# Patient Record
Sex: Female | Born: 1995 | Hispanic: Yes | Marital: Single | Smoking: Never smoker
Health system: Southern US, Community
[De-identification: ages and names within clinical notes are randomized; demographics above are authoritative.]

## PROBLEM LIST (undated history)

## (undated) ENCOUNTER — Inpatient Hospital Stay: Payer: Self-pay

## (undated) DIAGNOSIS — F4310 Post-traumatic stress disorder, unspecified: Secondary | ICD-10-CM

## (undated) DIAGNOSIS — O99019 Anemia complicating pregnancy, unspecified trimester: Secondary | ICD-10-CM

## (undated) DIAGNOSIS — F419 Anxiety disorder, unspecified: Secondary | ICD-10-CM

## (undated) DIAGNOSIS — F329 Major depressive disorder, single episode, unspecified: Secondary | ICD-10-CM

## (undated) DIAGNOSIS — F32A Depression, unspecified: Secondary | ICD-10-CM

## (undated) DIAGNOSIS — IMO0002 Reserved for concepts with insufficient information to code with codable children: Secondary | ICD-10-CM

## (undated) HISTORY — DX: Post-traumatic stress disorder, unspecified: F43.10

## (undated) HISTORY — PX: NO PAST SURGERIES: SHX2092

## (undated) HISTORY — DX: Anemia complicating pregnancy, unspecified trimester: O99.019

---
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
## 2017-02-03 ENCOUNTER — Emergency Department
Admission: EM | Admit: 2017-02-03 | Discharge: 2017-02-03 | Disposition: A | Discharge status: Home / Self Care | Payer: Self-pay | Attending: Emergency Medicine | Admitting: Emergency Medicine

## 2017-02-03 ENCOUNTER — Encounter: Payer: Self-pay | Admitting: Emergency Medicine

## 2017-02-03 DIAGNOSIS — N3000 Acute cystitis without hematuria: Secondary | ICD-10-CM | POA: Insufficient documentation

## 2017-02-03 DIAGNOSIS — J45909 Unspecified asthma, uncomplicated: Secondary | ICD-10-CM | POA: Insufficient documentation

## 2017-02-03 LAB — BASIC METABOLIC PANEL
ANION GAP: 5 (ref 5–15)
BUN: 11 mg/dL (ref 6–20)
CALCIUM: 8.8 mg/dL — AB (ref 8.9–10.3)
CO2: 27 mmol/L (ref 22–32)
Chloride: 106 mmol/L (ref 101–111)
Creatinine, Ser: 0.79 mg/dL (ref 0.44–1.00)
Glucose, Bld: 88 mg/dL (ref 65–99)
Potassium: 4 mmol/L (ref 3.5–5.1)
SODIUM: 138 mmol/L (ref 135–145)

## 2017-02-03 LAB — POCT PREGNANCY, URINE: PREG TEST UR: NEGATIVE

## 2017-02-03 LAB — URINALYSIS, COMPLETE (UACMP) WITH MICROSCOPIC
BACTERIA UA: NONE SEEN
Bilirubin Urine: NEGATIVE
Glucose, UA: NEGATIVE mg/dL
HGB URINE DIPSTICK: NEGATIVE
KETONES UR: NEGATIVE mg/dL
Nitrite: NEGATIVE
PROTEIN: NEGATIVE mg/dL
Specific Gravity, Urine: 1.019 (ref 1.005–1.030)
pH: 5 (ref 5.0–8.0)

## 2017-02-03 LAB — CBC
HEMATOCRIT: 34.6 % — AB (ref 35.0–47.0)
Hemoglobin: 11.4 g/dL — ABNORMAL LOW (ref 12.0–16.0)
MCH: 26.4 pg (ref 26.0–34.0)
MCHC: 33 g/dL (ref 32.0–36.0)
MCV: 80 fL (ref 80.0–100.0)
Platelets: 314 10*3/uL (ref 150–440)
RBC: 4.33 MIL/uL (ref 3.80–5.20)
RDW: 14.3 % (ref 11.5–14.5)
WBC: 11.2 10*3/uL — AB (ref 3.6–11.0)

## 2017-02-03 MED ORDER — SULFAMETHOXAZOLE-TRIMETHOPRIM 800-160 MG PO TABS: 1.0000 | ORAL_TABLET | Freq: Two times a day (BID) | ORAL | 0 refills | Status: AC

## 2017-02-03 MED ORDER — PHENAZOPYRIDINE HCL 200 MG PO TABS: 200.0000 mg | ORAL_TABLET | Freq: Three times a day (TID) | ORAL | 0 refills | Status: DC | PRN

## 2017-02-03 NOTE — Discharge Instructions (Signed)
Please call and schedule an appointment with urology if symptoms do not improve with medications. Take tylenol or ibuprofen if needed in addition to the pyridium for pain. Return to the ER for symptoms that change or worsen if unable to schedule an appointment with primary care or urology.
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
## 2017-02-03 NOTE — ED Triage Notes (Addendum)
Pt reports dysuria that started this afternoon. Pt also c/o pain at night. Pt states she is having right flank pain that started today. Pt states when she is urinating she stops mid stream because of the burning and is unable to go any further. Pt has hx of cystitis in May. Pt states yesterday Am she was feeling feverish and took tylenol, no fevers today.

## 2017-02-03 NOTE — ED Notes (Signed)
Patient refuses wheel chair. Patient verbalizes understanding of d/c instructions and follow-up. VS stable and pain controlled per patient.  Patient in NAD at time of d/c and denies further concerns regarding this visit. Patient stable at the time of departure from the unit, departing unit by the safest and most appropriate manner per that patients condition and limitations. Patient advised to return to the ED at any time for emergent concerns, or for new/worsening symptoms.

## 2017-02-03 NOTE — ED Notes (Signed)
Pt reports pain to right flank area 3 hrs ago, history of kidney infection. Denies any blood in urine reports burning sensation with urination

## 2017-09-13 ENCOUNTER — Other Ambulatory Visit: Payer: Self-pay

## 2017-09-13 DIAGNOSIS — R05 Cough: Secondary | ICD-10-CM | POA: Insufficient documentation

## 2017-09-13 DIAGNOSIS — Z79899 Other long term (current) drug therapy: Secondary | ICD-10-CM | POA: Insufficient documentation

## 2017-09-13 DIAGNOSIS — R21 Rash and other nonspecific skin eruption: Secondary | ICD-10-CM | POA: Insufficient documentation

## 2017-09-13 DIAGNOSIS — R1110 Vomiting, unspecified: Secondary | ICD-10-CM | POA: Insufficient documentation

## 2017-09-13 DIAGNOSIS — J45909 Unspecified asthma, uncomplicated: Secondary | ICD-10-CM | POA: Insufficient documentation

## 2017-09-13 NOTE — ED Triage Notes (Signed)
Pt arrives to ED via POV with c/o "rash on my face" that started at 5pm today and 1 episode of emesis after taking Nyquil 1 hr PTA. Pt states taking the Nyquil "because I couldn't sleep". Pt reports rhinitis, but denies any c/o cough, CP, or SHOB; no reports of abdominal pain. Pt is A&O, in NAD; RR even, regular, and unlabored.

## 2017-09-14 ENCOUNTER — Emergency Department
Admission: EM | Admit: 2017-09-14 | Discharge: 2017-09-14 | Disposition: A | Discharge status: Home / Self Care | Payer: Self-pay | Attending: Emergency Medicine | Admitting: Emergency Medicine

## 2017-09-14 DIAGNOSIS — R21 Rash and other nonspecific skin eruption: Secondary | ICD-10-CM

## 2017-09-14 DIAGNOSIS — R05 Cough: Secondary | ICD-10-CM

## 2017-09-14 DIAGNOSIS — R1110 Vomiting, unspecified: Secondary | ICD-10-CM

## 2017-09-14 DIAGNOSIS — R059 Cough, unspecified: Secondary | ICD-10-CM

## 2017-09-14 LAB — POCT PREGNANCY, URINE: Preg Test, Ur: NEGATIVE

## 2017-09-14 MED ORDER — ONDANSETRON HCL 4 MG PO TABS: 4.0000 mg | ORAL_TABLET | Freq: Three times a day (TID) | ORAL | 0 refills | Status: DC | PRN

## 2017-09-14 MED ORDER — BENZONATATE 100 MG PO CAPS: 100.0000 mg | ORAL_CAPSULE | Freq: Four times a day (QID) | ORAL | 0 refills | Status: DC | PRN

## 2017-09-14 NOTE — ED Notes (Signed)
Pt presents today with N/V. Pt is alone as chart states there is a legal guardian there is no name or phone number listed. Registration is checking further into this. RN will continue to monitor.

## 2017-09-14 NOTE — Discharge Instructions (Signed)
Please seek medical attention for any high fevers, chest pain, shortness of breath, change in behavior, persistent vomiting, bloody stool or any other new or concerning symptoms.  
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
## 2017-12-31 ENCOUNTER — Other Ambulatory Visit: Payer: Self-pay | Admitting: Nurse Practitioner

## 2017-12-31 DIAGNOSIS — Z369 Encounter for antenatal screening, unspecified: Secondary | ICD-10-CM

## 2018-01-17 ENCOUNTER — Ambulatory Visit
Admission: RE | Admit: 2018-01-17 | Discharge: 2018-01-17 | Disposition: A | Discharge status: Home / Self Care | Payer: Self-pay | Source: Ambulatory Visit | Attending: Maternal and Fetal Medicine | Admitting: Maternal and Fetal Medicine

## 2018-01-17 ENCOUNTER — Ambulatory Visit (HOSPITAL_BASED_OUTPATIENT_CLINIC_OR_DEPARTMENT_OTHER)
Admission: RE | Admit: 2018-01-17 | Discharge: 2018-01-17 | Disposition: A | Discharge status: Home / Self Care | Payer: Self-pay | Source: Ambulatory Visit | Attending: Maternal and Fetal Medicine | Admitting: Maternal and Fetal Medicine

## 2018-01-17 VITALS — BP 121/84 | HR 92 | Temp 98.3°F | Resp 18 | Wt 223.6 lb

## 2018-01-17 DIAGNOSIS — Z3A11 11 weeks gestation of pregnancy: Secondary | ICD-10-CM | POA: Insufficient documentation

## 2018-01-17 DIAGNOSIS — Z3169 Encounter for other general counseling and advice on procreation: Secondary | ICD-10-CM | POA: Insufficient documentation

## 2018-01-17 DIAGNOSIS — Z3481 Encounter for supervision of other normal pregnancy, first trimester: Secondary | ICD-10-CM

## 2018-01-17 DIAGNOSIS — Z369 Encounter for antenatal screening, unspecified: Secondary | ICD-10-CM | POA: Insufficient documentation

## 2018-01-17 NOTE — Progress Notes (Signed)
REferring MD: [facility] Length of Consultation: 30 minute consultation   Ms. Gloria Mccarthy  was referred to [facility] for genetic counseling to review prenatal screening and testing options.  This note summarizes the information we discussed.    We offered the following routine screening tests for this pregnancy:  First trimester screening, which includes nuchal translucency ultrasound screen and first trimester maternal serum marker screening.  The nuchal translucency has approximately an 80% detection rate for Down syndrome and can be positive for other chromosome abnormalities as well as congenital heart defects.  When combined with a maternal serum marker screening, the detection rate is up to 90% for Down syndrome and up to 97% for trisomy 18.     Maternal serum marker screening, a blood test that measures pregnancy proteins, can provide risk assessments for Down syndrome, trisomy 18, and open neural tube defects (spina bifida, anencephaly). Because it does not directly examine the fetus, it cannot positively diagnose or rule out these problems.  Targeted ultrasound uses high frequency sound waves to create an image of the developing fetus.  An ultrasound is often recommended as a routine means of evaluating the pregnancy.  It is also used to screen for fetal anatomy problems (for example, a heart defect) that might be suggestive of a chromosomal or other abnormality.   Should these screening tests indicate an increased concern, then the following additional testing options would be offered:  The chorionic villus sampling procedure is available for first trimester chromosome analysis.  This involves the withdrawal of a small amount of chorionic villi (tissue from the developing placenta).  Risk of pregnancy loss is estimated to be approximately 1 in 200 to 1 in 100 (0.5 to 1%).  There is approximately a 1% (1 in 100) chance that the CVS  chromosome results will be unclear.  Chorionic villi cannot be tested for neural tube defects.     Amniocentesis involves the removal of a small amount of amniotic fluid from the sac surrounding the fetus with the use of a thin needle inserted through the maternal abdomen and uterus.  Ultrasound guidance is used throughout the procedure.  Fetal cells from amniotic fluid are directly evaluated and > 99.5% of chromosome problems and > 98% of open neural tube defects can be detected. This procedure is generally performed after the 15th week of pregnancy.  The main risks to this procedure include complications leading to miscarriage in less than 1 in 200 cases (0.5%).  As another option for information if the pregnancy is suspected to be an an increased chance for certain chromosome conditions, we also reviewed the availability of cell free fetal DNA testing from maternal blood to determine whether or not the baby may have either Down syndrome, trisomy 5413, or trisomy 3318.  This test utilizes a maternal blood sample and DNA sequencing technology to isolate circulating cell free fetal DNA from maternal plasma.  The fetal DNA can then be analyzed for DNA sequences that are derived from the three most common chromosomes involved in aneuploidy, chromosomes 13, 18, and 21.  If the overall amount of DNA is greater than the expected level for any of these chromosomes, aneuploidy is suspected.  While we do not consider it a replacement for invasive testing and karyotype analysis, a negative result from this testing would be reassuring, though not a guarantee of a normal chromosome complement for the baby.  An abnormal result is certainly suggestive of an abnormal  chromosome complement, though we would still recommend CVS or amniocentesis to confirm any findings from this testing.  Cystic Fibrosis and Spinal Muscular Atrophy (SMA) screening were also discussed with the patient. Both conditions are recessive, which means that  both parents must be carriers in order to have a child with the disease.  Cystic fibrosis (CF) is one of the most common genetic conditions in persons of Caucasian ancestry.  This condition occurs in approximately 1 in 2,500 Caucasian persons and results in thickened secretions in the lungs, digestive, and reproductive systems.  For a baby to be at risk for having CF, both of the parents must be carriers for this condition.  Approximately 1 in 74 Caucasian persons is a carrier for CF.  Current carrier testing looks for the most common mutations in the gene for CF and can detect approximately 90% of carriers in the Caucasian population.  This means that the carrier screening can greatly reduce, but cannot eliminate, the chance for an individual to have a child with CF.  If an individual is found to be a carrier for CF, then carrier testing would be available for the partner. As part of [place]'s newborn screening profile, all babies born in the state of [place] will have a two-tier screening process.  Specimens are first tested to determine the concentration of immunoreactive trypsinogen (IRT).  The top 5% of specimens with the highest IRT values then undergo DNA testing using a panel of over 40 common CF mutations. SMA is a neurodegenerative disorder that leads to atrophy of skeletal muscle and overall weakness.  This condition is also more prevalent in the Caucasian population, with 1 in 40-1 in 60 persons being a carrier and 1 in 6,000-1 in 10,000 children being affected.  There are multiple forms of the disease, with some causing death in infancy to other forms with survival into adulthood.  The genetics of SMA is complex, but carrier screening can detect up to 95% of carriers in the Caucasian population.  Similar to CF, a negative result can greatly reduce, but cannot eliminate, the chance to have a child with SMA.  We obtained a detailed family history and pregnancy history.  The remainder of  the family history was reported to be unremarkable for birth defects, intellectual delays, recurrent pregnancy loss or known chromosome abnormalities.  Ms. Gloria Mccarthy is of [place] ancestry.  A hemoglobin electrophoresis was unavailable and could be considered if not already performed.   Ms. Gloria Mccarthy no pregnancy complications or exposures.  After consideration of the options, Ms. Gloria Mccarthy elected to proceed with an ultrasound only.  She is not interested in first trimester screening or maternal serum screening.  Carrier screening for CF and SMA were declined.  An ultrasound was performed at the time of the visit.  Fetal anatomy could not be assessed due to early gestational age.  Please refer to the ultrasound report for details of that study.  Ms. Gloria Mccarthy was encouraged to call with questions or concerns.  We can be contacted at [phone].  Labs ordered: NONE  Case discussed with me. Artemio Aly MD

## 2018-03-19 DIAGNOSIS — F411 Generalized anxiety disorder: Principal | ICD-10-CM

## 2018-03-19 DIAGNOSIS — F410 Panic disorder [episodic paroxysmal anxiety] without agoraphobia: Secondary | ICD-10-CM | POA: Insufficient documentation

## 2018-04-10 IMAGING — CT CT RENAL STONE PROTOCOL
3 of 4 series · 11 of 46 positions shown, 18 images · non-contrast
Comparison: None.

CLINICAL DATA: Painful urination beginning 3 days ago. Worsening
with development of low back pain.

EXAM:
CT ABDOMEN AND PELVIS WITHOUT CONTRAST
TECHNIQUE: Multidetector CT imaging of the abdomen and pelvis was performed
following the standard protocol without IV contrast.

[Series 4: lung bases · axial · 0.78mm/px · z∈[-634,-504]mm · 7 of 36 slices shown, 12 images]
[im 5/36  soft-tissue]
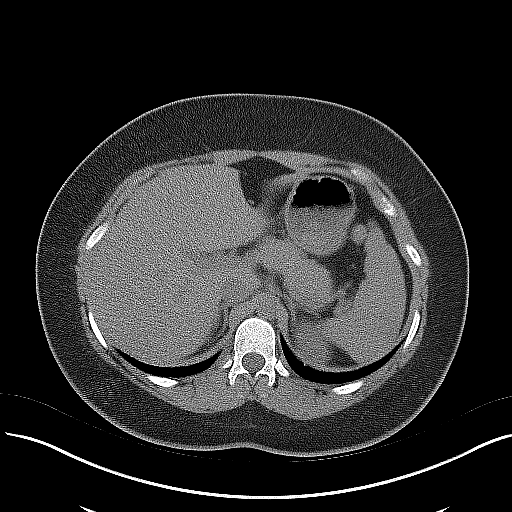
[im 5/36  bone]
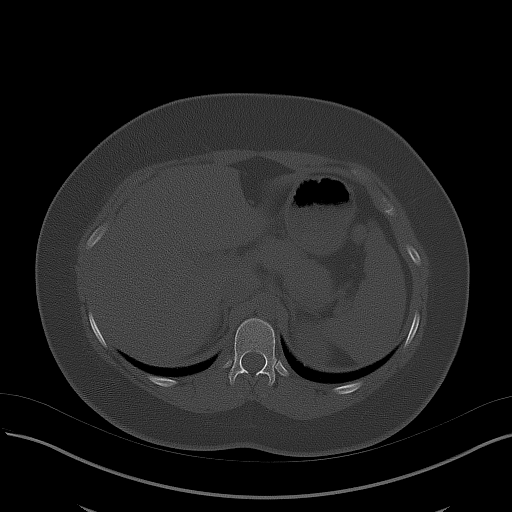
[im 9/36  soft-tissue]
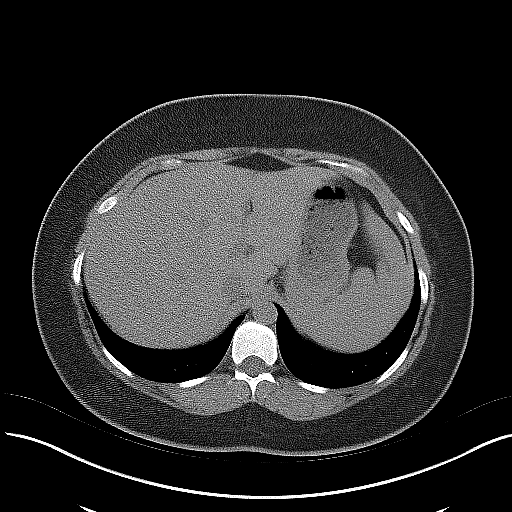
[im 14/36  soft-tissue]
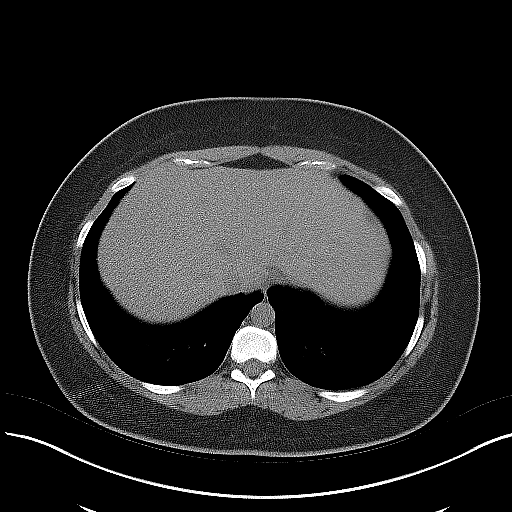
[im 18/36  soft-tissue]
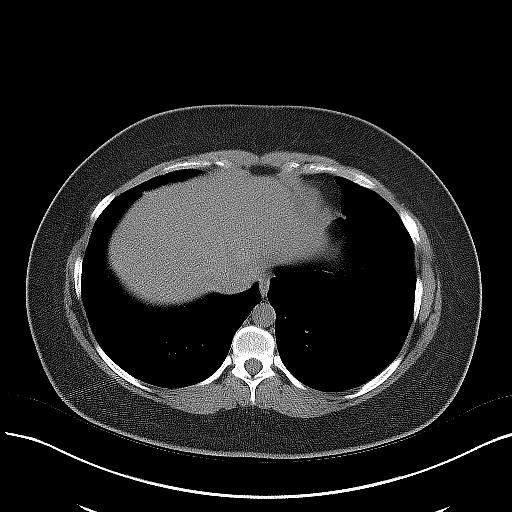
[im 18/36  lung]
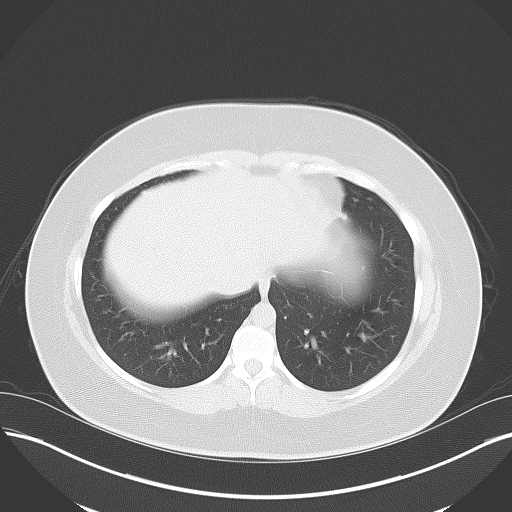
[im 22/36  soft-tissue]
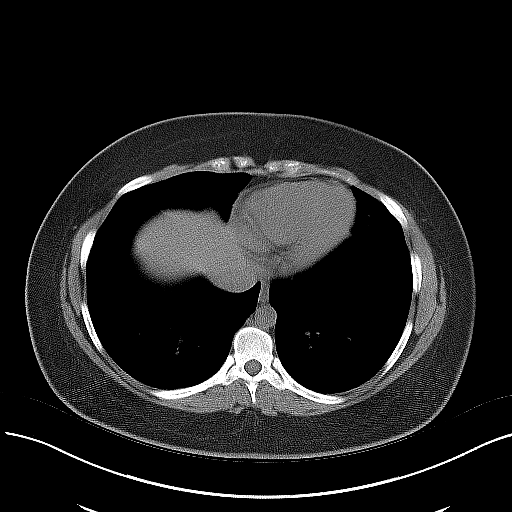
[im 22/36  lung]
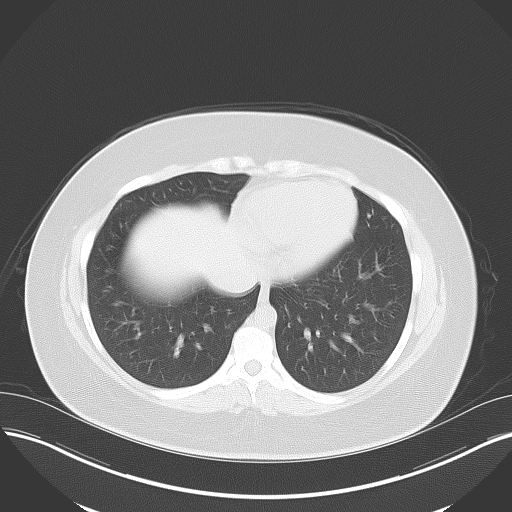
[im 27/36  soft-tissue]
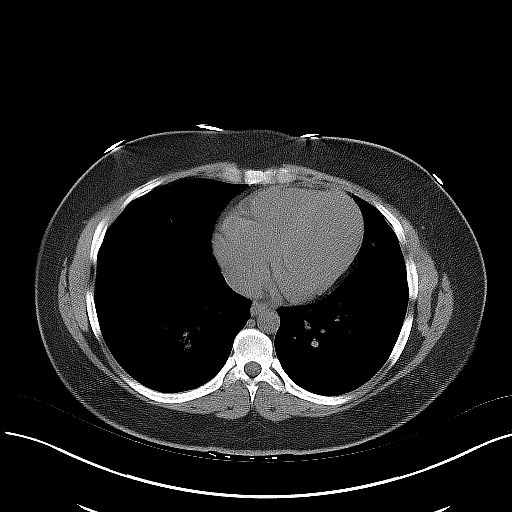
[im 27/36  lung]
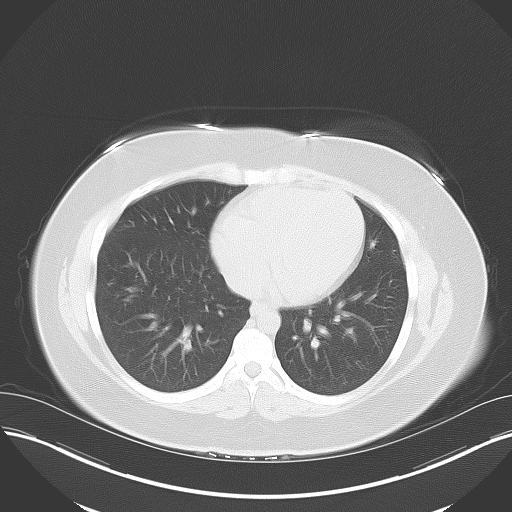
[im 31/36  soft-tissue]
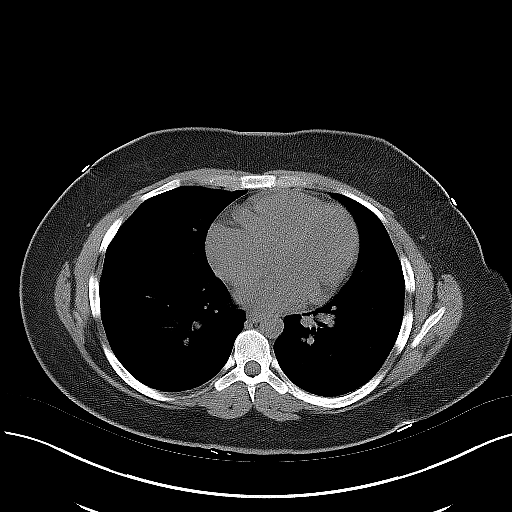
[im 31/36  lung]
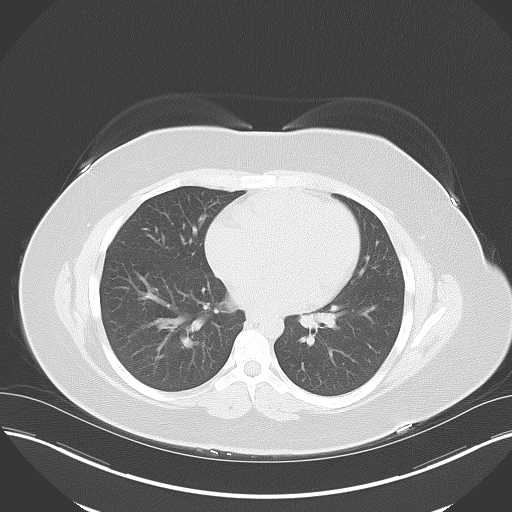

[Series 5: coronal · coronal · 0.79mm/px · 3 of 131 slices shown, 4 images]
[im 44/131  soft-tissue]
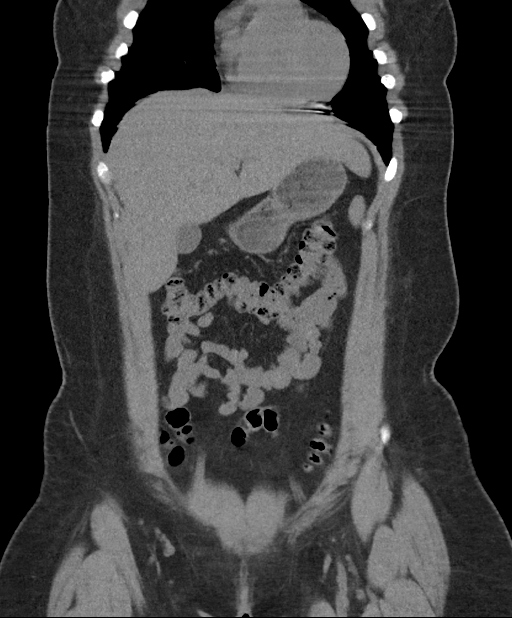
[im 58/131  soft-tissue]
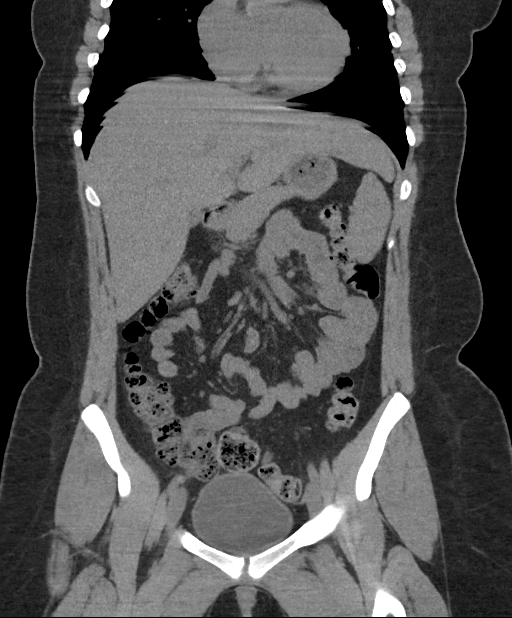
[im 58/131  bone]
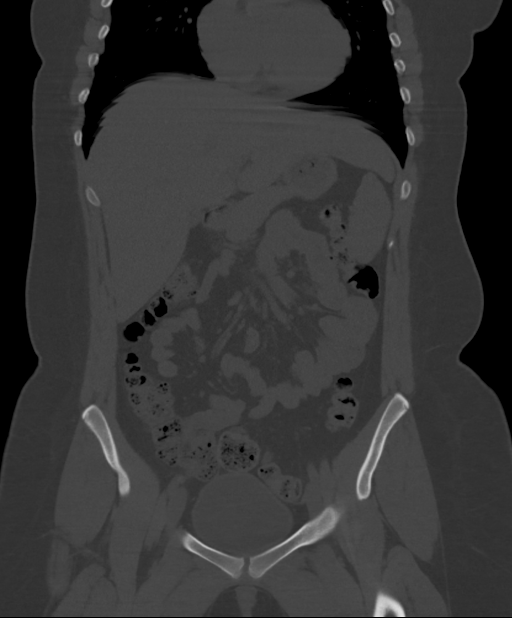
[im 73/131  soft-tissue]
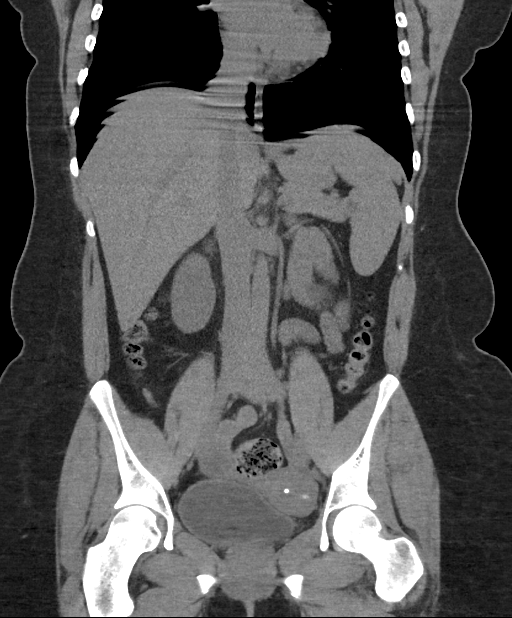

[Series 6: sagittal · sagittal · 0.66mm/px · 1 of 193 slices shown, 2 images]
[im 65/193  soft-tissue]
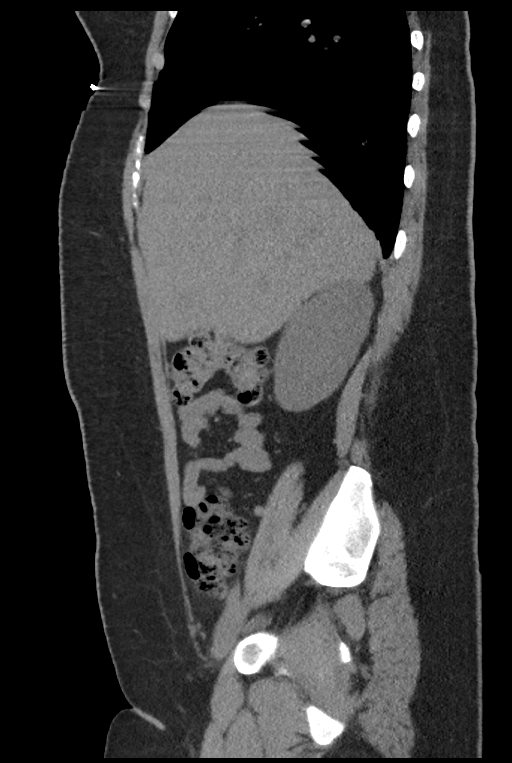
[im 65/193  bone]
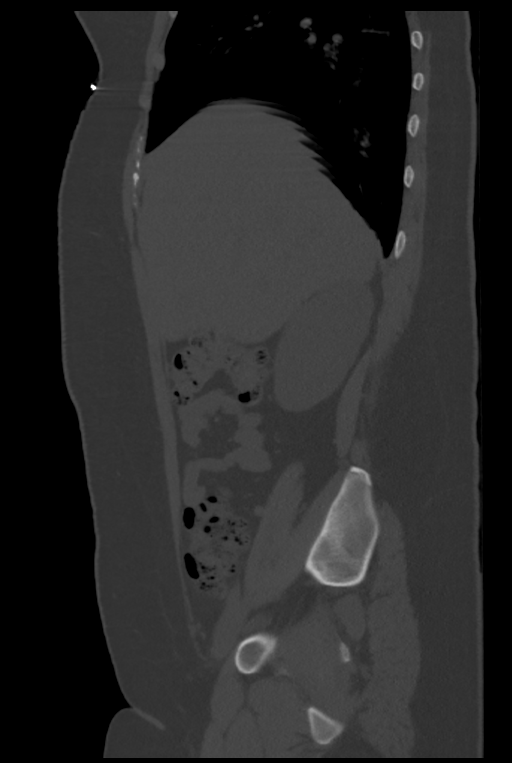

[11 of 46 positions shown; findings below may reference images not displayed]

FINDINGS: Lower chest: Normal

Hepatobiliary: Normal

Pancreas: Normal

Spleen: Normal

Adrenals/Urinary Tract: Adrenal glands are normal. Kidneys are
normal. No cyst, mass, stone or hydronephrosis. No stone in the
bladder. No bladder abnormality visible.

Stomach/Bowel: Normal

Vascular/Lymphatic: Normal

Reproductive: IUD in place. Bilateral ovarian cysts, likely
functional.

Other: No free fluid or air.

Musculoskeletal: Normal
IMPRESSION: Normal exam. No abnormality seen to explain the urinary tract
symptoms.

IUD in place. Unremarkable appearing ovarian cysts, likely
functional. No free fluid.

## 2018-05-06 ENCOUNTER — Other Ambulatory Visit: Payer: Self-pay

## 2018-05-06 ENCOUNTER — Observation Stay
Admission: EM | Admit: 2018-05-06 | Discharge: 2018-05-06 | Disposition: A | Discharge status: Home / Self Care | Payer: Self-pay | Attending: Obstetrics and Gynecology | Admitting: Obstetrics and Gynecology

## 2018-05-06 DIAGNOSIS — O26892 Other specified pregnancy related conditions, second trimester: Secondary | ICD-10-CM

## 2018-05-06 DIAGNOSIS — Z3A27 27 weeks gestation of pregnancy: Secondary | ICD-10-CM

## 2018-05-06 DIAGNOSIS — K6289 Other specified diseases of anus and rectum: Secondary | ICD-10-CM | POA: Insufficient documentation

## 2018-05-06 DIAGNOSIS — R102 Pelvic and perineal pain: Secondary | ICD-10-CM | POA: Insufficient documentation

## 2018-05-06 HISTORY — DX: Anxiety disorder, unspecified: F41.9

## 2018-05-06 HISTORY — DX: Major depressive disorder, single episode, unspecified: F32.9

## 2018-05-06 HISTORY — DX: Depression, unspecified: F32.A

## 2018-05-06 LAB — URINALYSIS, COMPLETE (UACMP) WITH MICROSCOPIC
Bilirubin Urine: NEGATIVE
GLUCOSE, UA: NEGATIVE mg/dL
HGB URINE DIPSTICK: NEGATIVE
Ketones, ur: NEGATIVE mg/dL
LEUKOCYTES UA: NEGATIVE
NITRITE: NEGATIVE
PH: 6 (ref 5.0–8.0)
Protein, ur: 30 mg/dL — AB
Specific Gravity, Urine: 1.027 (ref 1.005–1.030)
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
## 2018-05-06 NOTE — Discharge Summary (Signed)
See Final Progress Note 05/06/2018. 

## 2018-05-06 NOTE — OB Triage Note (Signed)
Pt presents with complaints of vaginal and rectal pressure since 0700. Denies N/V/D, intercourse in the last 24 hours and vaginal bleeding. Endorses Positive fetal movement.

## 2018-05-07 ENCOUNTER — Other Ambulatory Visit: Payer: Self-pay | Admitting: Advanced Practice Midwife

## 2018-05-07 DIAGNOSIS — Z3482 Encounter for supervision of other normal pregnancy, second trimester: Secondary | ICD-10-CM

## 2018-05-07 LAB — HM HIV SCREENING LAB: HM HIV Screening: NEGATIVE

## 2018-05-15 ENCOUNTER — Ambulatory Visit
Admission: RE | Admit: 2018-05-15 | Discharge: 2018-05-15 | Disposition: A | Discharge status: Home / Self Care | Payer: Self-pay | Source: Ambulatory Visit | Attending: Advanced Practice Midwife | Admitting: Advanced Practice Midwife

## 2018-05-15 DIAGNOSIS — Z3482 Encounter for supervision of other normal pregnancy, second trimester: Secondary | ICD-10-CM | POA: Insufficient documentation

## 2018-05-23 ENCOUNTER — Other Ambulatory Visit: Payer: Self-pay

## 2018-05-23 DIAGNOSIS — Z0489 Encounter for examination and observation for other specified reasons: Secondary | ICD-10-CM

## 2018-05-23 DIAGNOSIS — IMO0002 Reserved for concepts with insufficient information to code with codable children: Secondary | ICD-10-CM

## 2018-05-27 ENCOUNTER — Ambulatory Visit
Admission: RE | Admit: 2018-05-27 | Discharge: 2018-05-27 | Disposition: A | Discharge status: Home / Self Care | Payer: Self-pay | Source: Ambulatory Visit | Attending: Obstetrics & Gynecology | Admitting: Obstetrics & Gynecology

## 2018-05-27 DIAGNOSIS — IMO0002 Reserved for concepts with insufficient information to code with codable children: Secondary | ICD-10-CM

## 2018-05-27 DIAGNOSIS — Z0489 Encounter for examination and observation for other specified reasons: Secondary | ICD-10-CM | POA: Insufficient documentation

## 2018-06-13 ENCOUNTER — Other Ambulatory Visit: Payer: Self-pay | Admitting: Nurse Practitioner

## 2018-06-13 DIAGNOSIS — Z0489 Encounter for examination and observation for other specified reasons: Secondary | ICD-10-CM

## 2018-06-13 DIAGNOSIS — IMO0002 Reserved for concepts with insufficient information to code with codable children: Secondary | ICD-10-CM

## 2018-06-17 ENCOUNTER — Inpatient Hospital Stay: Admission: RE | Admit: 2018-06-17 | Payer: Self-pay | Source: Ambulatory Visit

## 2018-06-17 ENCOUNTER — Other Ambulatory Visit: Payer: Self-pay

## 2018-06-17 DIAGNOSIS — Z0489 Encounter for examination and observation for other specified reasons: Secondary | ICD-10-CM

## 2018-06-17 DIAGNOSIS — IMO0002 Reserved for concepts with insufficient information to code with codable children: Secondary | ICD-10-CM

## 2018-06-20 ENCOUNTER — Ambulatory Visit: Payer: Self-pay

## 2018-06-20 ENCOUNTER — Ambulatory Visit
Admission: RE | Admit: 2018-06-20 | Discharge: 2018-06-20 | Disposition: A | Discharge status: Home / Self Care | Payer: Self-pay | Source: Ambulatory Visit | Attending: Obstetrics and Gynecology | Admitting: Obstetrics and Gynecology

## 2018-06-20 DIAGNOSIS — Z0489 Encounter for examination and observation for other specified reasons: Secondary | ICD-10-CM | POA: Insufficient documentation

## 2018-06-20 DIAGNOSIS — Z3A33 33 weeks gestation of pregnancy: Secondary | ICD-10-CM | POA: Insufficient documentation

## 2018-06-20 DIAGNOSIS — IMO0002 Reserved for concepts with insufficient information to code with codable children: Secondary | ICD-10-CM

## 2018-07-16 ENCOUNTER — Other Ambulatory Visit: Payer: Self-pay

## 2018-07-16 ENCOUNTER — Observation Stay
Admission: EM | Admit: 2018-07-16 | Discharge: 2018-07-16 | Disposition: A | Discharge status: Home / Self Care | Payer: Self-pay | Attending: Obstetrics and Gynecology | Admitting: Obstetrics and Gynecology

## 2018-07-16 DIAGNOSIS — O471 False labor at or after 37 completed weeks of gestation: Secondary | ICD-10-CM

## 2018-07-16 DIAGNOSIS — Z3A37 37 weeks gestation of pregnancy: Secondary | ICD-10-CM

## 2018-07-16 NOTE — Discharge Summary (Signed)
Physician Discharge Summary   Patient ID: Gloria Mccarthy [id number] 22 y.o. 08-03-1995  Admit date: 07/16/2018  Discharge date and time: No discharge date for patient encounter.   Admitting Physician: Natale Milchhristanna R Euphemia Lingerfelt, MD   Discharge Physician: Adelene Idlerhristanna Lus Kriegel MD  Admission Diagnoses: 37 weeks preg contractions  Discharge Diagnoses: [redacted] week gestation, not in labor  Admission Condition: good  Discharged Condition: good  Indication for Admission: labor evaluation  Hospital Course: Patient was admitted for labor evaluation. She was checked by nursing and found to be the same dilation as in office earlier this week.NST was reactive. She was discharged home in stable condition.   Consults: None  Significant Diagnostic Studies: none  Treatments: none  Discharge Exam: BP 115/67 (BP Location: Left Arm)   Pulse 80   Temp 97.9 F (36.6 C) (Oral)   Resp 18   LMP 10/11/2017   General Appearance:    Alert, cooperative, no distress, appears stated age  Head:    Normocephalic, without obvious abnormality, atraumatic  Eyes:    PERRL, conjunctiva/corneas clear, EOM's intact, fundi    benign, both eyes  Ears:    Normal TM's and external ear canals, both ears  Nose:   Nares normal, septum midline, mucosa normal, no drainage    or sinus tenderness  Throat:   Lips, mucosa, and tongue normal; teeth and gums normal  Neck:   Supple, symmetrical, trachea midline, no adenopathy;    thyroid:  no enlargement/tenderness/nodules; no carotid   bruit or JVD  Back:     Symmetric, no curvature, ROM normal, no CVA tenderness  Lungs:     Clear to auscultation bilaterally, respirations unlabored  Chest Wall:    No tenderness or deformity   Heart:    Regular rate and rhythm, S1 and S2 normal, no murmur, rub   or gallop  Breast Exam:    No tenderness, masses, or nipple abnormality  Abdomen:     Soft, non-tender, bowel sounds active all four quadrants,    no masses, no organomegaly   Genitalia:    Normal female without lesion, discharge or tenderness  Rectal:    Normal tone, normal prostate, no masses or tenderness;   guaiac negative stool  Extremities:   Extremities normal, atraumatic, no cyanosis or edema  Pulses:   2+ and symmetric all extremities  Skin:   Skin color, texture, turgor normal, no rashes or lesions  Lymph nodes:   Cervical, supraclavicular, and axillary nodes normal  Neurologic:   CNII-XII intact, normal strength, sensation and reflexes    throughout    Disposition: Discharge disposition: 01-Home or Self Care       Patient Instructions:  Allergies as of 07/16/2018   No Known Allergies     Medication List    TAKE these medications   ferrous sulfate 325 (65 FE) MG EC tablet Take 325 mg by mouth 1 day or 1 dose.   prenatal multivitamin Tabs tablet Take 1 tablet by mouth daily at 12 noon.      Activity: activity as tolerated Diet: regular diet Wound Care: none needed  Follow-up with [facility] in 1 week.  Signed: Natale MilchChristanna R Lauranne Beyersdorf 07/16/2018 10:40 PM

## 2018-07-16 NOTE — OB Triage Note (Signed)
Pt presents with reports of CTX every 10-15 minutes. Denies bleeding, decreased fetal movement, and intercourse in the last 24 hours.

## 2018-07-24 ENCOUNTER — Observation Stay
Admission: EM | Admit: 2018-07-24 | Discharge: 2018-07-24 | Disposition: A | Discharge status: Home / Self Care | Payer: Self-pay | Attending: Obstetrics & Gynecology | Admitting: Obstetrics & Gynecology

## 2018-07-24 DIAGNOSIS — Z79899 Other long term (current) drug therapy: Secondary | ICD-10-CM | POA: Insufficient documentation

## 2018-07-24 DIAGNOSIS — O99343 Other mental disorders complicating pregnancy, third trimester: Secondary | ICD-10-CM | POA: Insufficient documentation

## 2018-07-24 DIAGNOSIS — Z3A38 38 weeks gestation of pregnancy: Secondary | ICD-10-CM | POA: Insufficient documentation

## 2018-07-24 DIAGNOSIS — F329 Major depressive disorder, single episode, unspecified: Secondary | ICD-10-CM | POA: Insufficient documentation

## 2018-07-24 DIAGNOSIS — F419 Anxiety disorder, unspecified: Secondary | ICD-10-CM | POA: Insufficient documentation

## 2018-07-24 NOTE — OB Triage Note (Signed)
Pt is a 22 y.o G1P0 at 143w5d that presents from ED c/o ctx every 4-225min. SVE 3/80/-1. Initial fht 125 with monitors applied and assessing. Pt denies LOF, VB, and states positive FM. No other complaints at this time. Will continue to monitor

## 2018-07-24 NOTE — Progress Notes (Signed)
S: Feels contraction are stronger and the same frequency. She has been walking since initial exam.  O: Cervical exam: 3 to 3.5/90/0 station  A: Slight change in exam from previous exam, however first exam was by RN.   P: Offered to let patient walk for another 2 hours to see if there is any change in her cervix vs going home to wait for increased intensity/frequency. She would like to stay and walk for 2 more hours. Reassess for cervical change in 2 hours.   Gloria MallJane Carvel Huskins, CNM
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
## 2018-07-24 NOTE — OB Triage Note (Signed)
Pt discharged home in stable condition. Pt instructed when to call provider or return for evaluation. Pt verbalized understanding of D/C instructions and has no further questions at this time.

## 2018-09-11 DIAGNOSIS — F411 Generalized anxiety disorder: Principal | ICD-10-CM

## 2018-09-11 DIAGNOSIS — F410 Panic disorder [episodic paroxysmal anxiety] without agoraphobia: Secondary | ICD-10-CM

## 2018-10-25 ENCOUNTER — Encounter (HOSPITAL_COMMUNITY): Payer: Self-pay

## 2018-12-04 ENCOUNTER — Encounter: Payer: Self-pay | Admitting: Intensive Care

## 2018-12-04 ENCOUNTER — Other Ambulatory Visit: Payer: Self-pay

## 2018-12-04 ENCOUNTER — Emergency Department
Admission: EM | Admit: 2018-12-04 | Discharge: 2018-12-04 | Disposition: A | Discharge status: Home / Self Care | Payer: Self-pay | Attending: Dermatology | Admitting: Dermatology

## 2018-12-04 ENCOUNTER — Emergency Department: Payer: Self-pay

## 2018-12-04 DIAGNOSIS — R51 Headache: Secondary | ICD-10-CM | POA: Insufficient documentation

## 2018-12-04 DIAGNOSIS — Z79899 Other long term (current) drug therapy: Secondary | ICD-10-CM | POA: Insufficient documentation

## 2018-12-04 DIAGNOSIS — M542 Cervicalgia: Secondary | ICD-10-CM | POA: Insufficient documentation

## 2018-12-04 LAB — BASIC METABOLIC PANEL
Anion gap: 9 (ref 5–15)
BUN: 12 mg/dL (ref 6–20)
CO2: 25 mmol/L (ref 22–32)
Calcium: 8.9 mg/dL (ref 8.9–10.3)
Chloride: 106 mmol/L (ref 98–111)
Creatinine, Ser: 0.6 mg/dL (ref 0.44–1.00)
GFR calc Af Amer: >60 mL/min (ref >60)
GFR calc non Af Amer: >60 mL/min (ref >60)
Glucose, Bld: 112 mg/dL — ABNORMAL HIGH (ref 70–99)
Potassium: 3.6 mmol/L (ref 3.5–5.1)
Sodium: 140 mmol/L (ref 135–145)

## 2018-12-04 LAB — CBC
HCT: 35.9 % — ABNORMAL LOW (ref 36.0–46.0)
Hemoglobin: 11.2 g/dL — ABNORMAL LOW (ref 12.0–15.0)
MCH: 24.7 pg — ABNORMAL LOW (ref 26.0–34.0)
MCHC: 31.2 g/dL (ref 30.0–36.0)
MCV: 79.2 fL — ABNORMAL LOW (ref 80.0–100.0)
Platelets: 312 10*3/uL (ref 150–400)
RBC: 4.53 MIL/uL (ref 3.87–5.11)
RDW: 14.5 % (ref 11.5–15.5)
WBC: 9.7 10*3/uL (ref 4.0–10.5)
nRBC: 0 % (ref 0.0–0.2)

## 2018-12-04 MED ORDER — IOHEXOL 350 MG/ML SOLN
75.0000 mL | Freq: Once | INTRAVENOUS | Status: AC | PRN
  Administered 2018-12-04: 75 mL via INTRAVENOUS
  Filled 2018-12-04: qty 75

## 2018-12-04 MED ORDER — MELOXICAM 15 MG PO TABS: 15.0000 mg | ORAL_TABLET | Freq: Every day | ORAL | 0 refills | Status: DC
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
## 2018-12-04 NOTE — ED Notes (Signed)
Waiting quietly for results  Explained the wait verbalizes understanding
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
## 2018-12-14 DIAGNOSIS — T7411XA Adult physical abuse, confirmed, initial encounter: Secondary | ICD-10-CM

## 2018-12-14 HISTORY — DX: Adult physical abuse, confirmed, initial encounter: T74.11XA

## 2019-03-21 ENCOUNTER — Other Ambulatory Visit: Payer: Self-pay

## 2019-03-21 DIAGNOSIS — Z20822 Contact with and (suspected) exposure to covid-19: Secondary | ICD-10-CM

## 2019-03-22 LAB — NOVEL CORONAVIRUS, NAA: SARS-CoV-2, NAA: NOT DETECTED

## 2019-03-25 ENCOUNTER — Telehealth: Payer: Self-pay

## 2019-03-25 NOTE — Telephone Encounter (Signed)
Patient called in and received her covid test results °

## 2019-05-14 ENCOUNTER — Telehealth: Payer: Self-pay | Admitting: Family Medicine

## 2019-05-14 NOTE — Telephone Encounter (Signed)
Has questions about IUD and stomach pains

## 2019-05-16 NOTE — Telephone Encounter (Signed)
TC to patient.  Reports had sex with tampon in and is worried about placement of IUD.  Has had some cramping since intercourse.  Patient forgot she had a tampon in.  Appt made for IUD check.  F/u earlier if discomfort worsens Gloria Fass, RN

## 2019-05-19 ENCOUNTER — Encounter: Payer: Self-pay | Admitting: Physician Assistant

## 2019-05-19 ENCOUNTER — Other Ambulatory Visit: Payer: Self-pay

## 2019-05-19 ENCOUNTER — Ambulatory Visit (LOCAL_COMMUNITY_HEALTH_CENTER): Payer: Self-pay | Admitting: Physician Assistant

## 2019-05-19 VITALS — BP 121/55 | Ht 64.0 in | Wt 218.0 lb

## 2019-05-19 DIAGNOSIS — Z3009 Encounter for other general counseling and advice on contraception: Secondary | ICD-10-CM

## 2019-05-19 DIAGNOSIS — Z30431 Encounter for routine checking of intrauterine contraceptive device: Secondary | ICD-10-CM

## 2019-05-19 NOTE — Progress Notes (Signed)
[facility] problem visit  [facility]  Subjective:  Gloria Mccarthy is a 23 y.o. being seen today for an IUD check.  Chief Complaint  Patient presents with  . Contraception    IUD check    HPI  Patient states that she is doing well with the IUD and desires to continue with it.  States that she forgot she had a tampon in and then had sex.  Reports that when she remembered about the tampon and removed it, she has some bleeding and cramping that has since resolved.  States that she checked her strings and thinks that she feels only one string and like there is a knot in them.  Would like to have a check to make sure her IUD appears to be in the correct place.   Does the patient have a current or past history of drug use? No   No components found for: HCV]   Health Maintenance Due  Topic Date Due  . CHLAMYDIA SCREENING  05/13/2013  . TETANUS/TDAP  05/13/2017  . INFLUENZA VACCINE  03/01/2019  . PAP-Cervical Cytology Screening  05/14/2019  . PAP SMEAR-Modifier  05/14/2019    Review of Systems  All other systems reviewed and are negative.   The following portions of the patient's history were reviewed and updated as appropriate: allergies, current medications, past family history, past medical history, past social history, past surgical history and problem list. Problem list updated.   See flowsheet for other program required questions.  Objective:   Vitals:   05/19/19 1116  BP: (!) 121/55  Weight: 218 lb (98.9 kg)  Height: 5\' 4"  (1.626 m)    Physical Exam Vitals signs reviewed.  Constitutional:      General: She is not in acute distress.    Appearance: Normal appearance.  HENT:     Head: Normocephalic and atraumatic.  Pulmonary:     Effort: Pulmonary effort is normal.  Abdominal:     Palpations: Abdomen is soft. There is no mass.     Tenderness: There is no abdominal tenderness. There is no guarding or rebound.   Genitourinary:    General: Normal vulva.     Rectum: Normal.     Comments: External genitalia/pubic area without nits, lice, edema, erythema, lesions and inguinal adenopathy. Vagina with normal mucosa and discharge. Cervix without visible lesions and IUD strings visualized and curled together in a clump. Uterus firm, mobile, nt, no masses, no CMT, no adnexal tenderness or fullness. Neurological:     Mental Status: She is alert and oriented to person, place, and time.  Psychiatric:        Mood and Affect: Mood normal.        Behavior: Behavior normal.        Thought Content: Thought content normal.        Judgment: Judgment normal.       Assessment and Plan:  Gloria Mccarthy is a 23 y.o. female presenting to the [facility] Department for a Women's Health problem visit  1. Encounter for counseling regarding contraception Counseled patient on options if have to remove IUD today. Rec condoms with all sex Patient states that she would want to RTC for reinsertion of another IUD.  2. Surveillance of previously prescribed intrauterine contraceptive device Reassured patient that IUD appears to be in appropriate place and that the strings may feel like they are knotted but there are 2 present and they are  curled together to make it feel like a knot. RTC prn.     No follow-ups on file.  No future appointments.  Jerene Dilling, PA
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
## 2019-06-09 ENCOUNTER — Other Ambulatory Visit: Payer: Self-pay

## 2019-06-09 DIAGNOSIS — Z20822 Contact with and (suspected) exposure to covid-19: Secondary | ICD-10-CM

## 2019-06-12 LAB — NOVEL CORONAVIRUS, NAA: SARS-CoV-2, NAA: DETECTED — AB

## 2019-07-18 ENCOUNTER — Telehealth: Payer: Self-pay | Admitting: Family Medicine

## 2019-07-18 NOTE — Telephone Encounter (Signed)
Returned patient phone call. Patient states she "thinks she may be pregnant and wants a blood test so she will know. Patient states she has an IUD in place and has checked and felt strings from IUD but she is experiencing dizziness and nausea. Patient states she "just feels like she is pregnant." RN counseled that we do not check for pregnancy by blood test but can scheduled patient for a urine pregnancy test but will have a cost for that. Patient requesting an appt for a PT. PT appt scheduled for 07/22/2019 @ 10:20. Instructed patient to arrive at 10:00 for check in. Patient also requesting a work note because she called out of work today due to above symptoms. RN explained that we could not provide that secondary to no OV for today to above complaints. Hal Morales, RN

## 2019-07-18 NOTE — Telephone Encounter (Signed)
Patient wants to speak to nurse about her bc and had also requested a blood work pregnancy test.

## 2019-11-27 ENCOUNTER — Other Ambulatory Visit: Payer: Self-pay

## 2019-11-27 ENCOUNTER — Encounter: Payer: Self-pay | Admitting: Physician Assistant

## 2019-11-27 ENCOUNTER — Ambulatory Visit (LOCAL_COMMUNITY_HEALTH_CENTER): Payer: Self-pay | Admitting: Physician Assistant

## 2019-11-27 VITALS — BP 108/69 | Ht 65.0 in | Wt 228.8 lb

## 2019-11-27 DIAGNOSIS — Z309 Encounter for contraceptive management, unspecified: Secondary | ICD-10-CM

## 2019-11-27 NOTE — Progress Notes (Signed)
Patient arrived late. Here for IUD check. States she has a Mirena IUD and can't feel strings, and has pain during intercourse. States Mirena inserted 10/18/2018.Marland KitchenMarland KitchenBurt Knack, RN

## 2019-11-27 NOTE — Progress Notes (Signed)
   [facility]  Subjective:  Gloria Mccarthy is a 24 y.o. being seen today for   Chief Complaint  Patient presents with  . Contraception    IUD check    HPI Here as she has not been able to feel her IUD strings for the past month. Occ abdominal discomfort with sex. No change in female partner. Feels well today. No vag disharge/odor/itch or STI exposure. Has occ short light menses.   Does the patient have a current or past history of drug use? No   No components found for: HCV]   Health Maintenance Due  Topic Date Due  . CHLAMYDIA SCREENING  Never done  . COVID-19 Vaccine (1) Never done  . TETANUS/TDAP  Never done  . PAP-Cervical Cytology Screening  Never done  . PAP SMEAR-Modifier  Never done    ROS  The following portions of the patient's history were reviewed and updated as appropriate: allergies, current medications, past family history, past medical history, past social history, past surgical history and problem list. Problem list updated.   See flowsheet for other program required questions.  Objective:   Vitals:   11/27/19 1101  BP: 108/69  Weight: 228 lb 12.8 oz (103.8 kg)  Height: 5\' 5"  (1.651 m)    Physical Exam Appears healthy, in NAD. Brief pelvic exam with palpable IUD strings projecting about 2cm from os. No CMT or adnexal/uterine tenderness.   Assessment and Plan:  Gloria Mccarthy is a 24 y.o. female presenting to the [facility] Department for a Women's Health problem visit  1. Encounter for contraceptive management, unspecified type Reassurance that IUD seems to be properly located in uterus, no exam findings c/w perforation or expulsion.  Return in about 11 months (around 10/26/2020) for annual well-woman exam.  No future appointments.  10/28/2020, PA-C

## 2020-07-20 ENCOUNTER — Ambulatory Visit: Payer: Self-pay | Admitting: Physician Assistant

## 2020-07-20 ENCOUNTER — Encounter: Payer: Self-pay | Admitting: Physician Assistant

## 2020-07-20 ENCOUNTER — Other Ambulatory Visit: Payer: Self-pay

## 2020-07-20 DIAGNOSIS — Z113 Encounter for screening for infections with a predominantly sexual mode of transmission: Secondary | ICD-10-CM

## 2020-07-20 LAB — WET PREP FOR TRICH, YEAST, CLUE
Trichomonas Exam: NEGATIVE
Yeast Exam: NEGATIVE

## 2020-07-20 NOTE — Progress Notes (Signed)
  [facility] Department STI clinic/screening visit  Subjective:  Gloria Mccarthy is a 24 y.o. female being seen today for an STI screening visit. The patient reports they do not have symptoms.  Patient reports that they do not desire a pregnancy in the next year.   They reported they are not interested in discussing contraception today.  Patient's last menstrual period was 06/19/2020.   Patient has the following medical conditions:   Patient Active Problem List   Diagnosis Date Noted  . [redacted] weeks gestation of pregnancy 07/16/2018  . Indication for care in labor and delivery, antepartum 05/06/2018  . Generalized anxiety disorder with panic attacks 03/19/2018  . Procreative management counseling 01/17/2018    Chief Complaint  Patient presents with  . SEXUALLY TRANSMITTED DISEASE    screening    HPI  Patient reports that she is not having any symptoms but would like a screening today.  Denies surgeries and regular medicines.  States that she has an IUD as her BCM.  Reports that her last HIV test was 3 years ago and that is about when she had her last pap.  See flowsheet for further details and programmatic requirements.    The following portions of the patient's history were reviewed and updated as appropriate: allergies, current medications, past medical history, past social history, past surgical history and problem list.  Objective:  There were no vitals filed for this visit.  Physical Exam Constitutional:      General: She is not in acute distress.    Appearance: Normal appearance.  HENT:     Head: Normocephalic and atraumatic.     Comments: No nits,lice, or hair loss. No cervical, supraclavicular or axillary adenopathy.    Mouth/Throat:     Mouth: Mucous membranes are moist.     Pharynx: Oropharynx is clear. No oropharyngeal exudate or posterior oropharyngeal erythema.  Eyes:     Conjunctiva/sclera: Conjunctivae normal.  Pulmonary:     Effort:  Pulmonary effort is normal.  Musculoskeletal:     Cervical back: Neck supple. No tenderness.  Skin:    General: Skin is warm and dry.     Findings: No bruising, erythema, lesion or rash.  Neurological:     Mental Status: She is alert and oriented to person, place, and time.  Psychiatric:        Mood and Affect: Mood normal.        Behavior: Behavior normal.        Thought Content: Thought content normal.        Judgment: Judgment normal.      Assessment and Plan:  Gloria Mccarthy is a 24 y.o. female presenting to the [facility] Department for STI screening  1. Screening for STD (sexually transmitted disease) Patient into clinic without symptoms.  Patient opts to self-collect vaginal samples for testing today.  Counseled how to collect for accurate results. Reviewed with patient that no treatment is indicated today based on wet mount results.  Rec condoms with all sex. Await test results.  Counseled that RN will call if needs to RTC for treatment once results are back. - WET PREP FOR TRICH, YEAST, CLUE - Gonococcus culture - Chlamydia/Gonorrhea [place] - [facility] - Syphilis Serology, [facility]     No follow-ups on file.  No future appointments.  Matt Holmes, PA

## 2020-07-25 LAB — GONOCOCCUS CULTURE

## 2020-08-04 ENCOUNTER — Telehealth: Payer: Self-pay

## 2020-08-05 ENCOUNTER — Other Ambulatory Visit: Payer: Self-pay

## 2020-08-05 ENCOUNTER — Ambulatory Visit: Payer: Self-pay

## 2020-08-05 DIAGNOSIS — A749 Chlamydial infection, unspecified: Secondary | ICD-10-CM

## 2020-08-05 MED ORDER — DOXYCYCLINE HYCLATE 100 MG PO TABS: 100.0000 mg | ORAL_TABLET | Freq: Two times a day (BID) | ORAL | 0 refills | Status: AC

## 2020-08-05 NOTE — Telephone Encounter (Signed)
TC to patient. Verified ID via password/SS#. Informed of positive chlamydia and need for tx. Instructed to eat before visit and have partner call for tx appt. Appt scheduled.Renaldo Gornick, RN    

## 2020-08-05 NOTE — Progress Notes (Signed)
Here for chlamydia treatmtment. Has IUD. Treated per [facility] Dr. Kirtland Bouchard. [place] with Doxycycline. Doxycycline 100mg  #14 dispensed with instructions to take twice daily by mouth for 7 days. Instructions given and advised to call [facility] to schedule re treatment if vomits within 2 hrs of taking med. Questions answered and reports understanding. , RN

## 2020-08-17 ENCOUNTER — Telehealth: Payer: Self-pay | Admitting: Licensed Clinical Social Worker

## 2020-08-17 NOTE — Telephone Encounter (Signed)
Patient  lvm on 08/12/20 requesting an appointment.

## 2021-03-09 ENCOUNTER — Other Ambulatory Visit: Payer: Self-pay

## 2021-03-09 ENCOUNTER — Encounter: Payer: Self-pay | Admitting: Emergency Medicine

## 2021-03-09 ENCOUNTER — Emergency Department: Payer: Self-pay

## 2021-03-09 ENCOUNTER — Emergency Department
Admission: EM | Admit: 2021-03-09 | Discharge: 2021-03-09 | Disposition: A | Discharge status: Home / Self Care | Payer: Self-pay | Attending: Emergency Medicine | Admitting: Emergency Medicine

## 2021-03-09 DIAGNOSIS — O4690 Antepartum hemorrhage, unspecified, unspecified trimester: Secondary | ICD-10-CM

## 2021-03-09 DIAGNOSIS — Z3491 Encounter for supervision of normal pregnancy, unspecified, first trimester: Secondary | ICD-10-CM

## 2021-03-09 DIAGNOSIS — N898 Other specified noninflammatory disorders of vagina: Secondary | ICD-10-CM | POA: Insufficient documentation

## 2021-03-09 DIAGNOSIS — Z3A01 Less than 8 weeks gestation of pregnancy: Secondary | ICD-10-CM | POA: Insufficient documentation

## 2021-03-09 DIAGNOSIS — N939 Abnormal uterine and vaginal bleeding, unspecified: Secondary | ICD-10-CM

## 2021-03-09 DIAGNOSIS — O209 Hemorrhage in early pregnancy, unspecified: Secondary | ICD-10-CM | POA: Insufficient documentation

## 2021-03-09 LAB — URINALYSIS, COMPLETE (UACMP) WITH MICROSCOPIC
Bacteria, UA: NONE SEEN
Bilirubin Urine: NEGATIVE
Glucose, UA: NEGATIVE mg/dL
Hgb urine dipstick: NEGATIVE
Ketones, ur: NEGATIVE mg/dL
Leukocytes,Ua: NEGATIVE
Nitrite: NEGATIVE
Protein, ur: NEGATIVE mg/dL
Specific Gravity, Urine: 1.018 (ref 1.005–1.030)
pH: 7 (ref 5.0–8.0)

## 2021-03-09 LAB — POC URINE PREG, ED: Preg Test, Ur: POSITIVE — AB

## 2021-03-09 LAB — HCG, QUANTITATIVE, PREGNANCY: hCG, Beta Chain, Quant, S: 6634 m[IU]/mL — ABNORMAL HIGH (ref <5)

## 2021-03-09 NOTE — ED Notes (Signed)
Lab to add on UA to sample down in lab.

## 2021-03-09 NOTE — ED Notes (Signed)
Pt reports that she has been having vaginal bleeding since yesterday. Reports some lower abd pain and cramping and lower back discomfort, pt states that she hasn't had any bleeding since this am and still having some discomfort. Pt reports that her last period was the first week of July and is uncertain how many weeks preg she is
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
## 2021-03-09 NOTE — ED Triage Notes (Signed)
Pt comes into the ED via POV c/o vaginal bleeding that started this morning.  Pt thinks she is roughly around [redacted] weeks pregnant but has not had her first OBGYN appt to confirm.  Pt started having bleeding this morning and filled one pad.  Bleeding has tapered off and is now a spotting.  Pt also reports cramping and a little back pain.

## 2021-03-14 ENCOUNTER — Other Ambulatory Visit: Payer: Self-pay

## 2021-03-14 ENCOUNTER — Ambulatory Visit (LOCAL_COMMUNITY_HEALTH_CENTER): Payer: Self-pay

## 2021-03-14 VITALS — BP 116/79 | Ht 65.0 in | Wt 192.5 lb

## 2021-03-14 DIAGNOSIS — Z3201 Encounter for pregnancy test, result positive: Secondary | ICD-10-CM

## 2021-03-14 LAB — PREGNANCY, URINE: Preg Test, Ur: POSITIVE — AB

## 2021-03-14 MED ORDER — PRENATAL VITAMIN 27-0.8 MG PO TABS: 1.0000 | ORAL_TABLET | Freq: Every day | ORAL | 0 refills | Status: AC

## 2021-03-14 NOTE — Progress Notes (Signed)
Pt states she was approximately 210 lbs before pregnancy. Pt desires prenatal care at [facility]; sent to preadmit. Pt states she got pregnant while using Mirena IUD and hospital removed IUD last week.

## 2021-04-20 ENCOUNTER — Encounter: Payer: Self-pay | Admitting: Family Medicine

## 2021-04-20 ENCOUNTER — Ambulatory Visit: Payer: Medicaid Other | Admitting: Family Medicine

## 2021-04-20 ENCOUNTER — Other Ambulatory Visit: Payer: Self-pay

## 2021-04-20 VITALS — BP 109/72 | HR 78 | Temp 97.9°F | Wt 191.8 lb

## 2021-04-20 DIAGNOSIS — O99210 Obesity complicating pregnancy, unspecified trimester: Secondary | ICD-10-CM | POA: Insufficient documentation

## 2021-04-20 DIAGNOSIS — O99211 Obesity complicating pregnancy, first trimester: Secondary | ICD-10-CM

## 2021-04-20 DIAGNOSIS — O09299 Supervision of pregnancy with other poor reproductive or obstetric history, unspecified trimester: Secondary | ICD-10-CM

## 2021-04-20 DIAGNOSIS — Z862 Personal history of diseases of the blood and blood-forming organs and certain disorders involving the immune mechanism: Secondary | ICD-10-CM | POA: Diagnosis not present

## 2021-04-20 DIAGNOSIS — Z3481 Encounter for supervision of other normal pregnancy, first trimester: Secondary | ICD-10-CM | POA: Diagnosis not present

## 2021-04-20 DIAGNOSIS — Z3480 Encounter for supervision of other normal pregnancy, unspecified trimester: Secondary | ICD-10-CM | POA: Insufficient documentation

## 2021-04-20 DIAGNOSIS — Z87898 Personal history of other specified conditions: Secondary | ICD-10-CM

## 2021-04-20 DIAGNOSIS — Z62810 Personal history of physical and sexual abuse in childhood: Secondary | ICD-10-CM | POA: Insufficient documentation

## 2021-04-20 DIAGNOSIS — F419 Anxiety disorder, unspecified: Secondary | ICD-10-CM | POA: Insufficient documentation

## 2021-04-20 DIAGNOSIS — Z8659 Personal history of other mental and behavioral disorders: Secondary | ICD-10-CM

## 2021-04-20 DIAGNOSIS — F32A Depression, unspecified: Secondary | ICD-10-CM | POA: Insufficient documentation

## 2021-04-20 HISTORY — DX: Obesity complicating pregnancy, unspecified trimester: O99.210

## 2021-04-20 LAB — URINALYSIS
Bilirubin, UA: NEGATIVE
Glucose, UA: NEGATIVE
Ketones, UA: NEGATIVE
Leukocytes,UA: NEGATIVE
Nitrite, UA: NEGATIVE
Protein,UA: NEGATIVE
RBC, UA: NEGATIVE
Specific Gravity, UA: 1.025 (ref 1.005–1.030)
Urobilinogen, Ur: 0.2 mg/dL (ref 0.2–1.0)
pH, UA: 6.5 (ref 5.0–7.5)

## 2021-04-20 LAB — HEMOGLOBIN, FINGERSTICK: Hemoglobin: 12.3 g/dL (ref 11.1–15.9)

## 2021-04-20 LAB — WET PREP FOR TRICH, YEAST, CLUE
Trichomonas Exam: NEGATIVE
Yeast Exam: NEGATIVE
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
## 2021-04-20 NOTE — Progress Notes (Signed)
Patient here for initial OB visit at 11w 3d.   Hgb, wet mount, and urinalysis WNL - no treatment indicated.   Patient aware [facility] will give her a call to set up an ultrasound appointment.   Nausea recommendations given, normal weight in pregnancy sheet given, and recommendation to take ASA 81mg  given per provider verbal order.   , SW card and [facility] card given to patient today.   All questions answered.   [facility], RN

## 2021-04-21 NOTE — Progress Notes (Signed)
[facility] referral for genetic counseling with [place] faxed 04/21/2021 with fax confirmation received. [facility] will notify client of appt via phone call. Jossie Ng, RN

## 2021-04-22 LAB — VARICELLA ZOSTER ANTIBODY, IGG: Varicella zoster IgG: 191 index (ref >165)

## 2021-04-23 LAB — COMPREHENSIVE METABOLIC PANEL
ALT: 42 IU/L — ABNORMAL HIGH (ref 0–32)
AST: 25 IU/L (ref 0–40)
Albumin/Globulin Ratio: 1.8 (ref 1.2–2.2)
Albumin: 4.2 g/dL (ref 3.9–5.0)
Alkaline Phosphatase: 135 IU/L — ABNORMAL HIGH (ref 44–121)
BUN/Creatinine Ratio: 10 (ref 9–23)
BUN: 5 mg/dL — ABNORMAL LOW (ref 6–20)
Bilirubin Total: 0.4 mg/dL (ref 0.0–1.2)
CO2: 19 mmol/L — ABNORMAL LOW (ref 20–29)
Calcium: 9.1 mg/dL (ref 8.7–10.2)
Chloride: 101 mmol/L (ref 96–106)
Creatinine, Ser: 0.48 mg/dL — ABNORMAL LOW (ref 0.57–1.00)
Globulin, Total: 2.4 g/dL (ref 1.5–4.5)
Glucose: 85 mg/dL (ref 65–99)
Potassium: 3.8 mmol/L (ref 3.5–5.2)
Sodium: 135 mmol/L (ref 134–144)
Total Protein: 6.6 g/dL (ref 6.0–8.5)
eGFR: 137 mL/min/{1.73_m2} (ref >59)

## 2021-04-23 LAB — HGB A1C W/O EAG: Hgb A1c MFr Bld: 5.2 % (ref 4.8–5.6)

## 2021-04-23 LAB — QUANTIFERON-TB GOLD PLUS
QuantiFERON Mitogen Value: >10 IU/mL
QuantiFERON Nil Value: 0.04 IU/mL
QuantiFERON TB1 Ag Value: 0.02 IU/mL
QuantiFERON TB2 Ag Value: 0.02 IU/mL
QuantiFERON-TB Gold Plus: NEGATIVE

## 2021-04-23 LAB — HCV INTERPRETATION

## 2021-04-23 LAB — URINE CULTURE: Organism ID, Bacteria: NO GROWTH

## 2021-04-23 LAB — PAP IG (IMAGE GUIDED): PAP Smear Comment: 0

## 2021-04-23 LAB — TSH: TSH: 1.63 u[IU]/mL (ref 0.450–4.500)

## 2021-04-23 LAB — GLUCOSE, 1 HOUR GESTATIONAL: Gestational Diabetes Screen: 98 mg/dL (ref 65–139)

## 2021-04-23 LAB — HCV AB W REFLEX TO QUANT PCR: HCV Ab: <0.1 s/co ratio (ref 0.0–0.9)

## 2021-04-24 LAB — CHLAMYDIA/GC NAA, CONFIRMATION
Chlamydia trachomatis, NAA: NEGATIVE
Neisseria gonorrhoeae, NAA: NEGATIVE

## 2021-04-25 ENCOUNTER — Telehealth: Payer: Self-pay | Admitting: Family Medicine

## 2021-04-25 LAB — HGB FRACTIONATION CASCADE
Hgb A2: 2.7 % (ref 1.8–3.2)
Hgb A: 97.3 % (ref 96.4–98.8)
Hgb F: 0 % (ref 0.0–2.0)
Hgb S: 0 %

## 2021-04-25 LAB — HIV-1/HIV-2 QUALITATIVE RNA
HIV-1 RNA, Qualitative: NONREACTIVE
HIV-2 RNA, Qualitative: NONREACTIVE

## 2021-04-25 NOTE — Telephone Encounter (Signed)
Returned call to patient who is asking about her test results from her initial OB appointment. She has reviewed her labs in MyChart and noted some CMP values out of range. She states she just got the results today. Patient counseled that RN's review some of the results and that CMP was sent to provider to review. Patient asking whether she needs to come in before her next scheduled appointment and counseled that once provider reviews results, RN will call if patient needs to come in sooner than next regular appointment. Patient states she is feeling fine and states understanding that RN will call if patient needs to come to clinic for more testing or treatment for anything. Patient states understanding and states no more questions at this time.Marland KitchenMarland KitchenBurt Knack, RN

## 2021-04-26 ENCOUNTER — Telehealth: Payer: Self-pay | Admitting: Dietician

## 2021-04-27 LAB — PROTEIN / CREATININE RATIO, URINE
Creatinine, Urine: 143.9 mg/dL
Protein, Ur: 9.1 mg/dL
Protein/Creat Ratio: 63 mg/g creat (ref 0–200)

## 2021-04-27 LAB — 789231 7+OXYCODONE-BUND
Amphetamines, Urine: NEGATIVE ng/mL
BENZODIAZ UR QL: NEGATIVE ng/mL
Barbiturate screen, urine: NEGATIVE ng/mL
Cocaine (Metab.): NEGATIVE ng/mL
OPIATE SCREEN URINE: NEGATIVE ng/mL
Oxycodone/Oxymorphone, Urine: NEGATIVE ng/mL
PCP Quant, Ur: NEGATIVE ng/mL

## 2021-04-27 LAB — CANNABINOID CONFIRMATION, UR
CANNABINOIDS: POSITIVE — AB
Carboxy THC GC/MS Conf: 631 ng/mL

## 2021-05-02 ENCOUNTER — Telehealth: Payer: Self-pay

## 2021-05-02 NOTE — Telephone Encounter (Signed)
Prenatal profile ordered on 04/20/2021 not obtained. RN verified with [employer] that no results pending.  Call to client and per mother, she is not home currently. Left message with mother that client needs appt to have some blood work done. Per mother, she will have client call us and number to call provided. Roddie Mc Yemen interpreted during call. Jossie Ng, RN

## 2021-05-03 NOTE — Telephone Encounter (Signed)
Call to client as needs to have prenatal profile drawn. Left message to call regarding need for appt for bloodwork and number to call provided. Jossie Ng, RN

## 2021-05-04 ENCOUNTER — Other Ambulatory Visit: Payer: Medicaid Other

## 2021-05-04 ENCOUNTER — Other Ambulatory Visit: Payer: Self-pay

## 2021-05-04 DIAGNOSIS — Z3480 Encounter for supervision of other normal pregnancy, unspecified trimester: Secondary | ICD-10-CM

## 2021-05-04 NOTE — Progress Notes (Signed)
Patient was here today for lab draw for prenatal profile without Varicella and Rubella. Sent straight to lab from [employer], RN

## 2021-05-05 LAB — CBC/D/PLT+RPR+RH+ABO+AB SCR
Antibody Screen: NEGATIVE
Basophils Absolute: 0 10*3/uL (ref 0.0–0.2)
Basos: 0 %
EOS (ABSOLUTE): 0.1 10*3/uL (ref 0.0–0.4)
Eos: 1 %
Hematocrit: 35.8 % (ref 34.0–46.6)
Hemoglobin: 11.9 g/dL (ref 11.1–15.9)
Hepatitis B Surface Ag: NEGATIVE
Immature Grans (Abs): 0 10*3/uL (ref 0.0–0.1)
Immature Granulocytes: 0 %
Lymphocytes Absolute: 1.8 10*3/uL (ref 0.7–3.1)
Lymphs: 19 %
MCH: 29.7 pg (ref 26.6–33.0)
MCHC: 33.2 g/dL (ref 31.5–35.7)
MCV: 89 fL (ref 79–97)
Monocytes Absolute: 0.3 10*3/uL (ref 0.1–0.9)
Monocytes: 3 %
Neutrophils Absolute: 7.3 10*3/uL — ABNORMAL HIGH (ref 1.4–7.0)
Neutrophils: 77 %
Platelets: 249 10*3/uL (ref 150–450)
RBC: 4.01 x10E6/uL (ref 3.77–5.28)
RDW: 12.4 % (ref 11.7–15.4)
RPR Ser Ql: NONREACTIVE
Rh Factor: POSITIVE
WBC: 9.6 10*3/uL (ref 3.4–10.8)

## 2021-05-06 ENCOUNTER — Encounter: Payer: Self-pay | Admitting: Family Medicine

## 2021-05-06 DIAGNOSIS — R748 Abnormal levels of other serum enzymes: Secondary | ICD-10-CM | POA: Insufficient documentation

## 2021-05-09 ENCOUNTER — Encounter: Payer: Self-pay | Admitting: Advanced Practice Midwife

## 2021-05-10 NOTE — Addendum Note (Signed)
Addended by: Heywood Bene on: 05/10/2021 11:06 AM   Modules accepted: Orders

## 2021-05-11 ENCOUNTER — Encounter: Payer: Self-pay | Admitting: Advanced Practice Midwife

## 2021-05-11 DIAGNOSIS — R825 Elevated urine levels of drugs, medicaments and biological substances: Secondary | ICD-10-CM

## 2021-05-11 HISTORY — DX: Elevated urine levels of drugs, medicaments and biological substances: R82.5

## 2021-05-18 ENCOUNTER — Other Ambulatory Visit: Payer: Self-pay

## 2021-05-18 ENCOUNTER — Ambulatory Visit: Payer: Medicaid Other

## 2021-05-18 ENCOUNTER — Ambulatory Visit: Payer: Medicaid Other | Admitting: Advanced Practice Midwife

## 2021-05-18 VITALS — BP 106/59 | HR 96 | Temp 97.3°F | Wt 198.8 lb

## 2021-05-18 DIAGNOSIS — F419 Anxiety disorder, unspecified: Secondary | ICD-10-CM

## 2021-05-18 DIAGNOSIS — Z87898 Personal history of other specified conditions: Secondary | ICD-10-CM

## 2021-05-18 DIAGNOSIS — O99210 Obesity complicating pregnancy, unspecified trimester: Secondary | ICD-10-CM

## 2021-05-18 DIAGNOSIS — O99212 Obesity complicating pregnancy, second trimester: Secondary | ICD-10-CM | POA: Diagnosis not present

## 2021-05-18 DIAGNOSIS — F32A Depression, unspecified: Secondary | ICD-10-CM

## 2021-05-18 DIAGNOSIS — Z3482 Encounter for supervision of other normal pregnancy, second trimester: Secondary | ICD-10-CM

## 2021-05-18 DIAGNOSIS — Z23 Encounter for immunization: Secondary | ICD-10-CM | POA: Diagnosis not present

## 2021-05-18 DIAGNOSIS — B86 Scabies: Secondary | ICD-10-CM | POA: Insufficient documentation

## 2021-05-18 DIAGNOSIS — Z3480 Encounter for supervision of other normal pregnancy, unspecified trimester: Secondary | ICD-10-CM

## 2021-05-18 DIAGNOSIS — R825 Elevated urine levels of drugs, medicaments and biological substances: Secondary | ICD-10-CM

## 2021-05-18 LAB — URINALYSIS
Bilirubin, UA: NEGATIVE
Glucose, UA: NEGATIVE
Ketones, UA: NEGATIVE
Leukocytes,UA: NEGATIVE
Nitrite, UA: NEGATIVE
Protein,UA: NEGATIVE
RBC, UA: NEGATIVE
Specific Gravity, UA: 1.025 (ref 1.005–1.030)
Urobilinogen, Ur: 0.2 mg/dL (ref 0.2–1.0)
pH, UA: 6.5 (ref 5.0–7.5)

## 2021-05-18 MED ORDER — PERMETHRIN 5 % EX CREA: TOPICAL_CREAM | Freq: Once | CUTANEOUS | Status: DC

## 2021-05-18 MED ORDER — PERMETHRIN 5 % EX CREA: 1.0000 "application " | TOPICAL_CREAM | Freq: Once | CUTANEOUS | 0 refills | Status: AC

## 2021-05-18 NOTE — Addendum Note (Signed)
Addended by: Floy Sabina on: 05/18/2021 06:00 PM   Modules accepted: Orders
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
## 2021-05-18 NOTE — Progress Notes (Signed)
Patient here at 15w 3d for MH RV.  Patient declined appointment with LCSW.   Flu vaccine given today.   Treatment for scabies given per SO and verbal order of E. Sciora, CNM. Patient to f/u in 2 weeks if no relief.   Quad screen declined and declination form signed.   Urinalysis reviewed during clinic visit.    Floy Sabina, RN

## 2021-05-19 NOTE — Progress Notes (Signed)
[facility] [place] faxed 05/19/21 with okay confirmation.   Floy Sabina, RN

## 2021-05-22 LAB — 789231 7+OXYCODONE-BUND
Amphetamines, Urine: NEGATIVE ng/mL
BENZODIAZ UR QL: NEGATIVE ng/mL
Barbiturate screen, urine: NEGATIVE ng/mL
Cocaine (Metab.): NEGATIVE ng/mL
OPIATE SCREEN URINE: NEGATIVE ng/mL
Oxycodone/Oxymorphone, Urine: NEGATIVE ng/mL
PCP Quant, Ur: NEGATIVE ng/mL

## 2021-05-22 LAB — CANNABINOID CONFIRMATION, UR
CANNABINOIDS: POSITIVE — AB
Carboxy THC GC/MS Conf: 586 ng/mL

## 2021-06-07 ENCOUNTER — Telehealth: Payer: Self-pay | Admitting: Family Medicine

## 2021-06-07 NOTE — Telephone Encounter (Signed)
EGA = 18 weeks. Client reports the following that occurred yesterday: was in the kitchen having a meal with her daughter and vision became foggy, developed head pressure "like my skull was being squeezed", heard ringing in ears and could hear daughter talking, but sounded like she was far away. Client states she took of her hoodie, sat down and drank some water. Above symptoms resolved spontaneously within 5 minutes. Reports above occurred x1 at ~ 8 weeks and again at 10 - 12 weeks of pregnancy. States yesterday symptoms lasted a little longer and were more intense. Also reports has bump on vaginal lip that is bleeding some and would like this assessed. Appt schedule for arrival time of 0820 in am. Client advised to go to ED for evaluation if above symptoms recur. Elveria Rising FNP-BC notified of above. Jossie Ng, RN

## 2021-06-07 NOTE — Telephone Encounter (Signed)
Pt has been feeling dizzy, pain in the back of her head and ringing in her ears. Please call her back to the home phone number (she is with her sister). Thanks :)

## 2021-06-08 ENCOUNTER — Ambulatory Visit: Payer: Self-pay | Admitting: Advanced Practice Midwife

## 2021-06-08 ENCOUNTER — Other Ambulatory Visit: Payer: Self-pay

## 2021-06-08 VITALS — BP 104/65 | HR 76 | Temp 97.8°F | Wt 192.0 lb

## 2021-06-08 DIAGNOSIS — F410 Panic disorder [episodic paroxysmal anxiety] without agoraphobia: Secondary | ICD-10-CM

## 2021-06-08 DIAGNOSIS — R825 Elevated urine levels of drugs, medicaments and biological substances: Secondary | ICD-10-CM

## 2021-06-08 DIAGNOSIS — F32A Depression, unspecified: Secondary | ICD-10-CM

## 2021-06-08 DIAGNOSIS — F419 Anxiety disorder, unspecified: Secondary | ICD-10-CM

## 2021-06-08 DIAGNOSIS — Z3482 Encounter for supervision of other normal pregnancy, second trimester: Secondary | ICD-10-CM

## 2021-06-08 DIAGNOSIS — F411 Generalized anxiety disorder: Secondary | ICD-10-CM

## 2021-06-08 DIAGNOSIS — Z3480 Encounter for supervision of other normal pregnancy, unspecified trimester: Secondary | ICD-10-CM

## 2021-06-08 DIAGNOSIS — O99210 Obesity complicating pregnancy, unspecified trimester: Secondary | ICD-10-CM

## 2021-06-08 DIAGNOSIS — O99212 Obesity complicating pregnancy, second trimester: Secondary | ICD-10-CM

## 2021-06-08 LAB — HEMOGLOBIN, FINGERSTICK: Hemoglobin: 11.6 g/dL (ref 11.1–15.9)
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
## 2021-06-08 NOTE — Progress Notes (Signed)
Patient here for OB problem at 18 3/7. See phone note from yesterday. States she has a bleeding place on labia and wants to have it checked.Burt Knack, RN

## 2021-06-14 LAB — 789231 7+OXYCODONE-BUND
Amphetamines, Urine: NEGATIVE ng/mL
BENZODIAZ UR QL: NEGATIVE ng/mL
Barbiturate screen, urine: NEGATIVE ng/mL
Cocaine (Metab.): NEGATIVE ng/mL
OPIATE SCREEN URINE: NEGATIVE ng/mL
Oxycodone/Oxymorphone, Urine: NEGATIVE ng/mL
PCP Quant, Ur: NEGATIVE ng/mL

## 2021-06-14 LAB — CANNABINOID CONFIRMATION, UR
CANNABINOIDS: POSITIVE — AB
Carboxy THC GC/MS Conf: 75 ng/mL

## 2021-06-15 ENCOUNTER — Telehealth: Payer: Self-pay

## 2021-06-15 ENCOUNTER — Ambulatory Visit: Payer: Self-pay

## 2021-06-15 NOTE — Telephone Encounter (Signed)
Client scheduled [facility] RV appt for 06/22/2021. Jossie Ng, RN

## 2021-06-15 NOTE — Telephone Encounter (Signed)
[facility] for [facility] RV 06/15/2021 am. Call to client and left message requesting she reschedule appt. Number to call provided. Jossie Ng, RN

## 2021-06-22 ENCOUNTER — Ambulatory Visit: Payer: Self-pay | Admitting: Family Medicine

## 2021-06-22 ENCOUNTER — Other Ambulatory Visit: Payer: Self-pay

## 2021-06-22 VITALS — BP 106/71 | HR 91 | Temp 98.0°F | Wt 193.4 lb

## 2021-06-22 DIAGNOSIS — Z3480 Encounter for supervision of other normal pregnancy, unspecified trimester: Secondary | ICD-10-CM

## 2021-06-22 DIAGNOSIS — O99212 Obesity complicating pregnancy, second trimester: Secondary | ICD-10-CM

## 2021-06-22 DIAGNOSIS — O99210 Obesity complicating pregnancy, unspecified trimester: Secondary | ICD-10-CM

## 2021-06-22 DIAGNOSIS — Z3482 Encounter for supervision of other normal pregnancy, second trimester: Secondary | ICD-10-CM

## 2021-06-22 DIAGNOSIS — F32A Depression, unspecified: Secondary | ICD-10-CM

## 2021-06-22 NOTE — Progress Notes (Signed)
Quad screen declined. Kept 06/13/2021 [facility] OB US appt. Jossie Ng, RN
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
## 2021-07-06 ENCOUNTER — Encounter: Payer: Self-pay | Admitting: Family Medicine

## 2021-07-20 ENCOUNTER — Ambulatory Visit: Payer: Self-pay | Admitting: Family Medicine

## 2021-07-20 ENCOUNTER — Other Ambulatory Visit: Payer: Self-pay

## 2021-07-20 ENCOUNTER — Ambulatory Visit: Payer: Self-pay

## 2021-07-20 VITALS — BP 117/64 | HR 82 | Temp 97.4°F | Wt 197.0 lb

## 2021-07-20 DIAGNOSIS — F419 Anxiety disorder, unspecified: Secondary | ICD-10-CM

## 2021-07-20 DIAGNOSIS — F32A Depression, unspecified: Secondary | ICD-10-CM

## 2021-07-20 DIAGNOSIS — Z3480 Encounter for supervision of other normal pregnancy, unspecified trimester: Secondary | ICD-10-CM
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
## 2021-08-17 ENCOUNTER — Ambulatory Visit: Payer: Self-pay | Admitting: Nurse Practitioner

## 2021-08-17 ENCOUNTER — Other Ambulatory Visit: Payer: Self-pay

## 2021-08-17 ENCOUNTER — Encounter: Payer: Self-pay | Admitting: Nurse Practitioner

## 2021-08-17 VITALS — BP 97/62 | HR 83 | Temp 97.8°F | Wt 205.2 lb

## 2021-08-17 DIAGNOSIS — Z3480 Encounter for supervision of other normal pregnancy, unspecified trimester: Secondary | ICD-10-CM

## 2021-08-17 DIAGNOSIS — Z23 Encounter for immunization: Secondary | ICD-10-CM

## 2021-08-17 DIAGNOSIS — R748 Abnormal levels of other serum enzymes: Secondary | ICD-10-CM

## 2021-08-17 DIAGNOSIS — F419 Anxiety disorder, unspecified: Secondary | ICD-10-CM

## 2021-08-17 DIAGNOSIS — O99212 Obesity complicating pregnancy, second trimester: Secondary | ICD-10-CM

## 2021-08-17 DIAGNOSIS — O99210 Obesity complicating pregnancy, unspecified trimester: Secondary | ICD-10-CM

## 2021-08-17 DIAGNOSIS — Z3483 Encounter for supervision of other normal pregnancy, third trimester: Secondary | ICD-10-CM

## 2021-08-17 DIAGNOSIS — F32A Depression, unspecified: Secondary | ICD-10-CM

## 2021-08-17 DIAGNOSIS — R825 Elevated urine levels of drugs, medicaments and biological substances: Secondary | ICD-10-CM

## 2021-08-17 LAB — HEMOGLOBIN, FINGERSTICK: Hemoglobin: 11.6 g/dL (ref 11.1–15.9)

## 2021-08-17 NOTE — Progress Notes (Signed)
Here today for 28.3 week Mh RV. Taking PNV and ASA QD. Denies ED/hospital visits since last RV. 28 week labs and Tdap today. Tawny Hopping, RN
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
## 2021-08-18 LAB — COMPREHENSIVE METABOLIC PANEL
ALT: 16 IU/L (ref 0–32)
AST: 12 IU/L (ref 0–40)
Albumin/Globulin Ratio: 1.4 (ref 1.2–2.2)
Albumin: 4 g/dL (ref 3.9–5.0)
Alkaline Phosphatase: 163 IU/L — ABNORMAL HIGH (ref 44–121)
BUN/Creatinine Ratio: 11 (ref 9–23)
BUN: 5 mg/dL — ABNORMAL LOW (ref 6–20)
Bilirubin Total: <0.2 mg/dL (ref 0.0–1.2)
CO2: 21 mmol/L (ref 20–29)
Calcium: 8.8 mg/dL (ref 8.7–10.2)
Chloride: 104 mmol/L (ref 96–106)
Creatinine, Ser: 0.47 mg/dL — ABNORMAL LOW (ref 0.57–1.00)
Globulin, Total: 2.8 g/dL (ref 1.5–4.5)
Glucose: 100 mg/dL — ABNORMAL HIGH (ref 70–99)
Potassium: 4.1 mmol/L (ref 3.5–5.2)
Sodium: 140 mmol/L (ref 134–144)
Total Protein: 6.8 g/dL (ref 6.0–8.5)
eGFR: 137 mL/min/{1.73_m2} (ref >59)

## 2021-08-18 LAB — 789231 7+OXYCODONE-BUND
Amphetamines, Urine: NEGATIVE ng/mL
BENZODIAZ UR QL: NEGATIVE ng/mL
Barbiturate screen, urine: NEGATIVE ng/mL
Cannabinoid Quant, Ur: NEGATIVE ng/mL
Cocaine (Metab.): NEGATIVE ng/mL
OPIATE SCREEN URINE: NEGATIVE ng/mL
Oxycodone/Oxymorphone, Urine: NEGATIVE ng/mL
PCP Quant, Ur: NEGATIVE ng/mL

## 2021-08-18 LAB — RPR: RPR Ser Ql: NONREACTIVE

## 2021-08-18 LAB — GLUCOSE, 1 HOUR GESTATIONAL: Gestational Diabetes Screen: 107 mg/dL (ref 70–139)

## 2021-08-19 LAB — HIV-1/HIV-2 QUALITATIVE RNA
HIV-1 RNA, Qualitative: NONREACTIVE
HIV-2 RNA, Qualitative: NONREACTIVE

## 2021-08-31 ENCOUNTER — Ambulatory Visit: Payer: Self-pay

## 2021-09-05 ENCOUNTER — Ambulatory Visit: Payer: Self-pay | Admitting: Advanced Practice Midwife

## 2021-09-05 ENCOUNTER — Other Ambulatory Visit: Payer: Self-pay

## 2021-09-05 VITALS — BP 97/65 | HR 82 | Temp 97.6°F | Wt 207.8 lb

## 2021-09-05 DIAGNOSIS — F419 Anxiety disorder, unspecified: Secondary | ICD-10-CM

## 2021-09-05 DIAGNOSIS — O99213 Obesity complicating pregnancy, third trimester: Secondary | ICD-10-CM

## 2021-09-05 DIAGNOSIS — F32A Depression, unspecified: Secondary | ICD-10-CM

## 2021-09-05 DIAGNOSIS — Z3483 Encounter for supervision of other normal pregnancy, third trimester: Secondary | ICD-10-CM

## 2021-09-05 DIAGNOSIS — O99210 Obesity complicating pregnancy, unspecified trimester: Secondary | ICD-10-CM

## 2021-09-05 DIAGNOSIS — Z3480 Encounter for supervision of other normal pregnancy, unspecified trimester: Secondary | ICD-10-CM
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
## 2021-09-05 NOTE — Progress Notes (Signed)
Patient here for MH RV at 31 1/7. Kick counts reviewed and cards given.Jenetta Downer, RN

## 2021-09-19 ENCOUNTER — Ambulatory Visit: Payer: Self-pay

## 2021-09-19 ENCOUNTER — Telehealth: Payer: Self-pay

## 2021-09-19 NOTE — Telephone Encounter (Signed)
Call to client to reschedule [facility] RV appt due to provider availability and mother answered phone stating client at work. Requested she assist [place] in contacting client to reschedule [facility] RV appt and she agreed to do so. Roddie Mc [place] assisted with call. Jossie Ng, RN

## 2021-09-20 ENCOUNTER — Ambulatory Visit: Payer: Self-pay | Admitting: Advanced Practice Midwife

## 2021-09-20 ENCOUNTER — Other Ambulatory Visit: Payer: Self-pay

## 2021-09-20 VITALS — BP 100/67 | HR 90 | Temp 98.0°F | Wt 211.2 lb

## 2021-09-20 DIAGNOSIS — F419 Anxiety disorder, unspecified: Secondary | ICD-10-CM

## 2021-09-20 DIAGNOSIS — O99210 Obesity complicating pregnancy, unspecified trimester: Secondary | ICD-10-CM

## 2021-09-20 DIAGNOSIS — Z3483 Encounter for supervision of other normal pregnancy, third trimester: Secondary | ICD-10-CM

## 2021-09-20 DIAGNOSIS — O99213 Obesity complicating pregnancy, third trimester: Secondary | ICD-10-CM

## 2021-09-20 DIAGNOSIS — R825 Elevated urine levels of drugs, medicaments and biological substances: Secondary | ICD-10-CM

## 2021-09-20 DIAGNOSIS — F32A Depression, unspecified: Secondary | ICD-10-CM

## 2021-09-20 DIAGNOSIS — Z3480 Encounter for supervision of other normal pregnancy, unspecified trimester: Secondary | ICD-10-CM

## 2021-09-20 DIAGNOSIS — R748 Abnormal levels of other serum enzymes: Secondary | ICD-10-CM

## 2021-09-20 LAB — URINALYSIS
Bilirubin, UA: NEGATIVE
Glucose, UA: NEGATIVE
Ketones, UA: NEGATIVE
Leukocytes,UA: NEGATIVE
Nitrite, UA: NEGATIVE
Protein,UA: NEGATIVE
RBC, UA: NEGATIVE
Specific Gravity, UA: 1.025 (ref 1.005–1.030)
Urobilinogen, Ur: 0.2 mg/dL (ref 0.2–1.0)
pH, UA: 7 (ref 5.0–7.5)

## 2021-09-20 NOTE — Telephone Encounter (Signed)
Client scheduled [facility] RV appt for today at 1100. Jossie Ng, RN

## 2021-09-20 NOTE — Progress Notes (Signed)
Repeat CMP today per E. Sciora CNM order (refer to provider review of CMP on 08/17/21). Verified has [facility] contact card. Denies questions regarding doing kick counts. Jossie Ng, RN Urine dip reviewed - negative results. Jossie Ng, RN
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
## 2021-09-21 LAB — COMPREHENSIVE METABOLIC PANEL
ALT: 21 IU/L (ref 0–32)
AST: 16 IU/L (ref 0–40)
Albumin/Globulin Ratio: 1.6 (ref 1.2–2.2)
Albumin: 3.9 g/dL (ref 3.9–5.0)
Alkaline Phosphatase: 193 IU/L — ABNORMAL HIGH (ref 44–121)
BUN/Creatinine Ratio: 12 (ref 9–23)
BUN: 7 mg/dL (ref 6–20)
Bilirubin Total: 0.2 mg/dL (ref 0.0–1.2)
CO2: 21 mmol/L (ref 20–29)
Calcium: 9 mg/dL (ref 8.7–10.2)
Chloride: 102 mmol/L (ref 96–106)
Creatinine, Ser: 0.57 mg/dL (ref 0.57–1.00)
Globulin, Total: 2.5 g/dL (ref 1.5–4.5)
Glucose: 98 mg/dL (ref 70–99)
Potassium: 3.8 mmol/L (ref 3.5–5.2)
Sodium: 136 mmol/L (ref 134–144)
Total Protein: 6.4 g/dL (ref 6.0–8.5)
eGFR: 131 mL/min/{1.73_m2} (ref >59)

## 2021-09-21 LAB — 789231 7+OXYCODONE-BUND
Amphetamines, Urine: NEGATIVE ng/mL
BENZODIAZ UR QL: NEGATIVE ng/mL
Barbiturate screen, urine: NEGATIVE ng/mL
Cannabinoid Quant, Ur: NEGATIVE ng/mL
Cocaine (Metab.): NEGATIVE ng/mL
OPIATE SCREEN URINE: NEGATIVE ng/mL
Oxycodone/Oxymorphone, Urine: NEGATIVE ng/mL
PCP Quant, Ur: NEGATIVE ng/mL

## 2021-10-04 ENCOUNTER — Ambulatory Visit: Payer: Self-pay | Admitting: Advanced Practice Midwife

## 2021-10-04 ENCOUNTER — Other Ambulatory Visit: Payer: Self-pay

## 2021-10-04 VITALS — BP 107/71 | HR 85 | Temp 97.7°F | Wt 215.2 lb

## 2021-10-04 DIAGNOSIS — O99210 Obesity complicating pregnancy, unspecified trimester: Secondary | ICD-10-CM

## 2021-10-04 DIAGNOSIS — O99213 Obesity complicating pregnancy, third trimester: Secondary | ICD-10-CM

## 2021-10-04 DIAGNOSIS — F32A Depression, unspecified: Secondary | ICD-10-CM

## 2021-10-04 DIAGNOSIS — R748 Abnormal levels of other serum enzymes: Secondary | ICD-10-CM

## 2021-10-04 DIAGNOSIS — Z3483 Encounter for supervision of other normal pregnancy, third trimester: Secondary | ICD-10-CM

## 2021-10-04 DIAGNOSIS — Z3480 Encounter for supervision of other normal pregnancy, unspecified trimester: Secondary | ICD-10-CM

## 2021-10-04 DIAGNOSIS — F419 Anxiety disorder, unspecified: Secondary | ICD-10-CM

## 2021-10-04 NOTE — Progress Notes (Signed)
Denies pain currently. ?
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
## 2021-10-05 LAB — COMPREHENSIVE METABOLIC PANEL
ALT: 16 IU/L (ref 0–32)
AST: 12 IU/L (ref 0–40)
Albumin/Globulin Ratio: 1.4 (ref 1.2–2.2)
Albumin: 3.8 g/dL — ABNORMAL LOW (ref 3.9–5.0)
Alkaline Phosphatase: 218 IU/L — ABNORMAL HIGH (ref 44–121)
BUN/Creatinine Ratio: 8 — ABNORMAL LOW (ref 9–23)
BUN: 4 mg/dL — ABNORMAL LOW (ref 6–20)
Bilirubin Total: <0.2 mg/dL (ref 0.0–1.2)
CO2: 19 mmol/L — ABNORMAL LOW (ref 20–29)
Calcium: 8.6 mg/dL — ABNORMAL LOW (ref 8.7–10.2)
Chloride: 102 mmol/L (ref 96–106)
Creatinine, Ser: 0.49 mg/dL — ABNORMAL LOW (ref 0.57–1.00)
Globulin, Total: 2.7 g/dL (ref 1.5–4.5)
Glucose: 78 mg/dL (ref 70–99)
Potassium: 4.3 mmol/L (ref 3.5–5.2)
Sodium: 135 mmol/L (ref 134–144)
Total Protein: 6.5 g/dL (ref 6.0–8.5)
eGFR: 136 mL/min/{1.73_m2} (ref >59)

## 2021-10-06 LAB — URINE CULTURE: Organism ID, Bacteria: NO GROWTH

## 2021-10-13 ENCOUNTER — Ambulatory Visit: Payer: Self-pay | Admitting: Advanced Practice Midwife

## 2021-10-13 ENCOUNTER — Other Ambulatory Visit: Payer: Self-pay

## 2021-10-13 VITALS — BP 108/72 | HR 84 | Temp 97.0°F | Wt 217.4 lb

## 2021-10-13 DIAGNOSIS — O99210 Obesity complicating pregnancy, unspecified trimester: Secondary | ICD-10-CM

## 2021-10-13 DIAGNOSIS — O99213 Obesity complicating pregnancy, third trimester: Secondary | ICD-10-CM

## 2021-10-13 DIAGNOSIS — F411 Generalized anxiety disorder: Secondary | ICD-10-CM

## 2021-10-13 DIAGNOSIS — F410 Panic disorder [episodic paroxysmal anxiety] without agoraphobia: Secondary | ICD-10-CM

## 2021-10-13 DIAGNOSIS — Z3483 Encounter for supervision of other normal pregnancy, third trimester: Secondary | ICD-10-CM

## 2021-10-13 DIAGNOSIS — O99013 Anemia complicating pregnancy, third trimester: Secondary | ICD-10-CM

## 2021-10-13 DIAGNOSIS — Z3480 Encounter for supervision of other normal pregnancy, unspecified trimester: Secondary | ICD-10-CM

## 2021-10-13 DIAGNOSIS — F419 Anxiety disorder, unspecified: Secondary | ICD-10-CM

## 2021-10-13 DIAGNOSIS — R748 Abnormal levels of other serum enzymes: Secondary | ICD-10-CM

## 2021-10-13 DIAGNOSIS — F32A Depression, unspecified: Secondary | ICD-10-CM

## 2021-10-13 LAB — HEMOGLOBIN, FINGERSTICK: Hemoglobin: 10.8 g/dL — ABNORMAL LOW (ref 11.1–15.9)

## 2021-10-13 NOTE — Progress Notes (Addendum)
Here today for 36.4 week MH RV. Taking PNV QD did stop taking ASA this past Sunday. Denies ED/hospital visits since last RV. 36 week packet and labs today. Complains of frequent nausea for the past week especially in the mornings. Declines to self collect cultures.  Tawny Hopping, RN ? ?
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
## 2021-10-15 LAB — CHLAMYDIA/GC NAA, CONFIRMATION
Chlamydia trachomatis, NAA: NEGATIVE
Neisseria gonorrhoeae, NAA: NEGATIVE

## 2021-10-17 ENCOUNTER — Telehealth: Payer: Self-pay

## 2021-10-17 LAB — CULTURE, BETA STREP (GROUP B ONLY): Strep Gp B Culture: NEGATIVE

## 2021-10-17 NOTE — Telephone Encounter (Signed)
Client needs to initiate iron QD. Call to client and left message to call Tuesday am regarding need for iron and that may pick up from [facility]. Number to call provided. Jossie Ng, RN ? ?

## 2021-10-18 ENCOUNTER — Telehealth: Payer: Self-pay

## 2021-10-18 DIAGNOSIS — O99019 Anemia complicating pregnancy, unspecified trimester: Secondary | ICD-10-CM

## 2021-10-18 HISTORY — DX: Anemia complicating pregnancy, unspecified trimester: O99.019

## 2021-10-18 MED ORDER — IRON (FERROUS SULFATE) 325 (65 FE) MG PO TABS: 1.0000 | ORAL_TABLET | Freq: Every day | ORAL | 0 refills | Status: AC

## 2021-10-18 NOTE — Telephone Encounter (Signed)
Phone call to [facility] to add Anemia Panel to 10/13/21 Hgb specimen. Tawny Hopping, RN ? ?

## 2021-10-18 NOTE — Progress Notes (Signed)
Hgb reviewed. Iron initiated per standing orders. Will recheck Iron in one month. Tawny Hopping, RN ? ?

## 2021-10-18 NOTE — Addendum Note (Signed)
Addended by: Hal Morales A on: 10/18/2021 04:29 PM ? ? Modules accepted: Orders ? ?

## 2021-10-20 ENCOUNTER — Ambulatory Visit: Payer: Self-pay | Admitting: Nurse Practitioner

## 2021-10-20 ENCOUNTER — Encounter: Payer: Self-pay | Admitting: Nurse Practitioner

## 2021-10-20 ENCOUNTER — Other Ambulatory Visit: Payer: Self-pay

## 2021-10-20 VITALS — BP 108/72 | HR 80 | Temp 97.6°F | Wt 217.4 lb

## 2021-10-20 DIAGNOSIS — R748 Abnormal levels of other serum enzymes: Secondary | ICD-10-CM

## 2021-10-20 DIAGNOSIS — Z3483 Encounter for supervision of other normal pregnancy, third trimester: Secondary | ICD-10-CM

## 2021-10-20 DIAGNOSIS — O99019 Anemia complicating pregnancy, unspecified trimester: Secondary | ICD-10-CM

## 2021-10-20 DIAGNOSIS — O99013 Anemia complicating pregnancy, third trimester: Secondary | ICD-10-CM

## 2021-10-20 DIAGNOSIS — F419 Anxiety disorder, unspecified: Secondary | ICD-10-CM

## 2021-10-20 DIAGNOSIS — O99213 Obesity complicating pregnancy, third trimester: Secondary | ICD-10-CM

## 2021-10-20 DIAGNOSIS — O99210 Obesity complicating pregnancy, unspecified trimester: Secondary | ICD-10-CM

## 2021-10-20 DIAGNOSIS — F32A Depression, unspecified: Secondary | ICD-10-CM

## 2021-10-20 DIAGNOSIS — R825 Elevated urine levels of drugs, medicaments and biological substances: Secondary | ICD-10-CM

## 2021-10-20 DIAGNOSIS — Z3480 Encounter for supervision of other normal pregnancy, unspecified trimester: Secondary | ICD-10-CM

## 2021-10-20 NOTE — Progress Notes (Signed)
Correctly verbalizes how to take PNV and iron tablet. Taking iron with orange juice. Counseled 36 week cultures negative. Jossie Ng, RN ? ?
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
## 2021-10-27 ENCOUNTER — Encounter: Payer: Self-pay | Admitting: Physician Assistant

## 2021-10-27 ENCOUNTER — Ambulatory Visit: Payer: Self-pay | Admitting: Physician Assistant

## 2021-10-27 VITALS — BP 122/81 | HR 105 | Temp 99.3°F | Wt 218.8 lb

## 2021-10-27 DIAGNOSIS — O99210 Obesity complicating pregnancy, unspecified trimester: Secondary | ICD-10-CM

## 2021-10-27 DIAGNOSIS — R825 Elevated urine levels of drugs, medicaments and biological substances: Secondary | ICD-10-CM

## 2021-10-27 DIAGNOSIS — O99019 Anemia complicating pregnancy, unspecified trimester: Secondary | ICD-10-CM

## 2021-10-27 DIAGNOSIS — O99213 Obesity complicating pregnancy, third trimester: Secondary | ICD-10-CM

## 2021-10-27 DIAGNOSIS — Z3480 Encounter for supervision of other normal pregnancy, unspecified trimester: Secondary | ICD-10-CM

## 2021-10-27 DIAGNOSIS — Z3483 Encounter for supervision of other normal pregnancy, third trimester: Secondary | ICD-10-CM

## 2021-10-27 DIAGNOSIS — F419 Anxiety disorder, unspecified: Secondary | ICD-10-CM

## 2021-10-27 DIAGNOSIS — Z87898 Personal history of other specified conditions: Secondary | ICD-10-CM

## 2021-10-27 DIAGNOSIS — O99013 Anemia complicating pregnancy, third trimester: Secondary | ICD-10-CM

## 2021-10-27 NOTE — Progress Notes (Signed)
Correctly verbalizes how to take iron and prenatal vitamin. Taking iron tablet with juice. Niels Cranshaw, RN  
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
## 2021-11-03 ENCOUNTER — Ambulatory Visit: Payer: Self-pay

## 2021-12-13 ENCOUNTER — Encounter: Payer: Self-pay | Admitting: Family Medicine

## 2021-12-13 ENCOUNTER — Ambulatory Visit: Payer: Self-pay | Admitting: Family Medicine

## 2021-12-13 DIAGNOSIS — Z30011 Encounter for initial prescription of contraceptive pills: Secondary | ICD-10-CM

## 2021-12-13 DIAGNOSIS — D649 Anemia, unspecified: Secondary | ICD-10-CM

## 2021-12-13 LAB — HM HIV SCREENING LAB: HM HIV Screening: NEGATIVE

## 2021-12-13 LAB — HEMOGLOBIN, FINGERSTICK: Hemoglobin: 10.6 g/dL — ABNORMAL LOW (ref 11.1–15.9)

## 2021-12-13 MED ORDER — IRON (FERROUS SULFATE) 325 (65 FE) MG PO TABS: 1.0000 | ORAL_TABLET | Freq: Two times a day (BID) | ORAL | 0 refills | Status: AC

## 2021-12-13 MED ORDER — NORETHINDRONE 0.35 MG PO TABS: 1.0000 | ORAL_TABLET | Freq: Every day | ORAL | 12 refills | Status: DC

## 2021-12-13 NOTE — Progress Notes (Addendum)
Hgb 10.6. Iron PO BID initiated with instructions to take with a vitamin C source and to return in one month for Hgb recheck. OCP's x6 packs dispensed per provider verbal orders. Instructed patient to return for remainder of OCP Rx when she begins 5th pack of pills. Tawny Hopping, RN ? ? ?

## 2021-12-13 NOTE — Progress Notes (Signed)
Here today for 6.0 week PP appt. NSVD female at 39.2 weeks at [facility]. Last PE and Pap Smear (NIL) was 04/20/2021. Hgb today. Wants bloodwork for HIV/Syphilis. Considering OCP's for PP BCM. Tawny Hopping, RN ? ?

## 2021-12-13 NOTE — Progress Notes (Signed)
[facility] Department ? ?Postpartum Exam ? ?Gloria Mccarthy is a 26 y.o. G49P2012 female who presents for a postpartum visit. She is 6 weeks postpartum following a normal spontaneous vaginal delivery.  I have fully reviewed the prenatal and intrapartum course. The delivery was at 39 gestational weeks.  Anesthesia: none. Postpartum course has been going well, adjusting as mom after 3 years. Baby is doing well. Baby is feeding by both breast and bottle - gerber gentle start . Bleeding no bleeding. Bowel function is normal. Bladder function is normal. Patient is sexually active. Contraception method is none. Postpartum depression screening: feeling better to be able to get things taken care of chores  scored 4 on EDS . ? ? ?The pregnancy intention screening data noted above was reviewed. Potential methods of contraception were discussed. The patient elected to proceed with No data recorded. ? ? ? ?Health Maintenance Due  ?Topic Date Due  ? HPV VACCINES (1 - 2-dose series) Never done  ? COVID-19 Vaccine (3 - Pfizer risk series) 06/09/2020  ? ? ?The following portions of the patient's history were reviewed and updated as appropriate: allergies, current medications, past family history, past medical history, past social history, past surgical history, and problem list. ? ?Review of Systems ?Pertinent items are noted in HPI. ? ?Objective:  ?There were no vitals taken for this visit.  ? ?General:  alert, cooperative, appears stated age, no distress, and moderately obese  ? Breasts:  normal  ?Lungs: clear to auscultation bilaterally  ?Heart:  regular rate and rhythm, S1, S2 normal, no murmur, click, rub or gallop  ?Abdomen: soft, non-tender; bowel sounds normal; no masses,  no organomegaly   ?Wound well approximated incision  ?GU exam:  normal  ?     ?Assessment:  ? ? There are no diagnoses linked to this encounter. ? ? postpartum exam.  ? ?Plan:  ? ?Essential components of care per ACOG  recommendations: ? ?1.  Mood and well being: Patient with negative depression screening today. Reviewed local resources for support.  ?- Patient tobacco use? No.   ?- hx of drug use? Yes used mj this weekend   ? ?2. Infant care and feeding:  ?-Patient currently breastmilk feeding? Yes. Discussed returning to work and pumping.  ?-Social determinants of health (SDOH) reviewed in EPIC. No concerns. The following needs were identified N/I  ? ?3. Sexuality, contraception and birth spacing ?- Patient does not want a pregnancy in the next year.  Desired family size is 4 children.  ?- Reviewed reproductive life planning. Reviewed options based on patient desire and reproductive life plan. Patient is interested in Oral Contraceptive. This was provided to the patient today.  ? ?Risks, benefits, and typical effectiveness rates were reviewed.  Questions were answered.  Written information was also given to the patient to review.   ? ?The patient will follow up in  3 months for surveillance.  The patient was told to call with any further questions, or with any concerns about this method of contraception.  Emphasized use of condoms 100% of the time for STI prevention. ? ?Patient was offered ECP based on Unprotected sex within past 72 hours.  Patient is within 2 days of unprotected sex. Patient was offered ECP. Reviewed options and patient desired No method of ECP, declined all   ? ?- Discussed birth spacing of 18 months ? ?4. Sleep and fatigue ?-Encouraged family/partner/community support of 4 hrs of uninterrupted sleep to help with mood and fatigue ? ?  5. Physical Recovery  ?- Discussed patients delivery and complications. She describes her labor as good. ?- Patient had a Vaginal, no problems at delivery. Patient had a cervical laceration. Perineal healing reviewed. Patient expressed understanding ?- Patient has urinary incontinence? No. ?- Patient is safe to resume physical and sexual activity ? ?6.  Health Maintenance ?- HM due  items addressed Yes ?- Last pap smear 04/20/2021 ?Pap smear not done at today's visit.  ?-Breast Cancer screening indicated? No.  ? ?7. Chronic Disease/Pregnancy Condition follow up: Anemia ? ?1. Postpartum exam ? ?- Hemoglobin, venipuncture ?- HIV [facility] ?- Syphilis Serology, [facility] ?- Iron, Ferrous Sulfate, 325 (65 Fe) MG TABS; Take 1 tablet by mouth in the morning and at bedtime.  Dispense: 100 tablet; Refill: 0 ? ?2. Encounter for initial prescription of contraceptive pills ?Pt has unprotected sex 2 days ago.  Discussed using condoms as back up with pills and taking PT in 2 weeks from sex and notify clinic.  ?- norethindrone (MICRONOR) 0.35 MG tablet; Take 1 tablet (0.35 mg total) by mouth daily.  Dispense: 28 tablet; Refill: 12 ? ?3. Anemia, unspecified type ?Pt encourage to continue with taking Iron  ? ?- Iron, Ferrous Sulfate, 325 (65 Fe) MG TABS; Take 1 tablet by mouth in the morning and at bedtime.  Dispense: 100 tablet; Refill: 0 ? ? ?Gloria Snipes, FNP ? ? ?

## 2022-03-14 ENCOUNTER — Telehealth: Payer: Self-pay

## 2022-03-14 NOTE — Telephone Encounter (Signed)
Telephone call to patient today regarding the need for a repeat PAP and physical due 03-2022.  Appointment scheduled for 04-20-2022 at 10 am (9:30 arrival time).  Hart Carwin, RN

## 2022-04-20 ENCOUNTER — Ambulatory Visit: Payer: Self-pay

## 2022-07-03 ENCOUNTER — Ambulatory Visit (LOCAL_COMMUNITY_HEALTH_CENTER): Payer: Self-pay | Admitting: Family Medicine

## 2022-07-03 ENCOUNTER — Encounter: Payer: Self-pay | Admitting: Family Medicine

## 2022-07-03 ENCOUNTER — Ambulatory Visit: Payer: Self-pay

## 2022-07-03 VITALS — BP 122/80 | Ht 65.0 in | Wt 223.0 lb

## 2022-07-03 DIAGNOSIS — Z113 Encounter for screening for infections with a predominantly sexual mode of transmission: Secondary | ICD-10-CM

## 2022-07-03 DIAGNOSIS — Z3009 Encounter for other general counseling and advice on contraception: Secondary | ICD-10-CM

## 2022-07-03 DIAGNOSIS — Z30017 Encounter for initial prescription of implantable subdermal contraceptive: Secondary | ICD-10-CM

## 2022-07-03 LAB — WET PREP FOR TRICH, YEAST, CLUE
Trichomonas Exam: NEGATIVE
Yeast Exam: NEGATIVE

## 2022-07-03 LAB — HM HIV SCREENING LAB: HM HIV Screening: NEGATIVE

## 2022-07-03 MED ORDER — ETONOGESTREL 68 MG ~~LOC~~ IMPL
68.0000 mg | DRUG_IMPLANT | Freq: Once | SUBCUTANEOUS | Status: AC
  Administered 2022-07-03: 68 mg via SUBCUTANEOUS

## 2022-07-03 MED ORDER — ULIPRISTAL ACETATE 30 MG PO TABS: 1.0000 | ORAL_TABLET | Freq: Once | ORAL | 0 refills | Status: AC
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
## 2022-07-03 NOTE — Progress Notes (Unsigned)
Pt appointment for birth control counseling and STI screening. Seen by FNP Sydnee Levans. Birth control counseling provided. Nexplanon inserted by FNP Sydnee Levans. Initial lab results all negative and reviewed with pt.

## 2023-02-11 ENCOUNTER — Other Ambulatory Visit: Payer: Self-pay

## 2023-02-11 ENCOUNTER — Emergency Department: Payer: Self-pay

## 2023-02-11 ENCOUNTER — Emergency Department
Admission: EM | Admit: 2023-02-11 | Discharge: 2023-02-11 | Disposition: A | Discharge status: Home / Self Care | Payer: Self-pay | Attending: Emergency Medicine | Admitting: Emergency Medicine

## 2023-02-11 DIAGNOSIS — Z8616 Personal history of COVID-19: Secondary | ICD-10-CM | POA: Insufficient documentation

## 2023-02-11 DIAGNOSIS — G43709 Chronic migraine without aura, not intractable, without status migrainosus: Secondary | ICD-10-CM | POA: Insufficient documentation

## 2023-02-11 DIAGNOSIS — D72829 Elevated white blood cell count, unspecified: Secondary | ICD-10-CM | POA: Insufficient documentation

## 2023-02-11 LAB — CBC WITH DIFFERENTIAL/PLATELET
Abs Immature Granulocytes: 0.08 10*3/uL — ABNORMAL HIGH (ref 0.00–0.07)
Basophils Absolute: 0 10*3/uL (ref 0.0–0.1)
Basophils Relative: 0 %
Eosinophils Absolute: 0 10*3/uL (ref 0.0–0.5)
Eosinophils Relative: 0 %
HCT: 38.2 % (ref 36.0–46.0)
Hemoglobin: 12.5 g/dL (ref 12.0–15.0)
Immature Granulocytes: 1 %
Lymphocytes Relative: 8 %
Lymphs Abs: 1 10*3/uL (ref 0.7–4.0)
MCH: 26.8 pg (ref 26.0–34.0)
MCHC: 32.7 g/dL (ref 30.0–36.0)
MCV: 82 fL (ref 80.0–100.0)
Monocytes Absolute: 0.3 10*3/uL (ref 0.1–1.0)
Monocytes Relative: 2 %
Neutro Abs: 11.1 10*3/uL — ABNORMAL HIGH (ref 1.7–7.7)
Neutrophils Relative %: 89 %
Platelets: 289 10*3/uL (ref 150–400)
RBC: 4.66 MIL/uL (ref 3.87–5.11)
RDW: 14.4 % (ref 11.5–15.5)
WBC: 12.5 10*3/uL — ABNORMAL HIGH (ref 4.0–10.5)
nRBC: 0 % (ref 0.0–0.2)

## 2023-02-11 LAB — COMPREHENSIVE METABOLIC PANEL
ALT: 91 U/L — ABNORMAL HIGH (ref 0–44)
AST: 31 U/L (ref 15–41)
Albumin: 4.6 g/dL (ref 3.5–5.0)
Alkaline Phosphatase: 114 U/L (ref 38–126)
Anion gap: 10 (ref 5–15)
BUN: 8 mg/dL (ref 6–20)
CO2: 18 mmol/L — ABNORMAL LOW (ref 22–32)
Calcium: 8.9 mg/dL (ref 8.9–10.3)
Chloride: 110 mmol/L (ref 98–111)
Creatinine, Ser: 0.64 mg/dL (ref 0.44–1.00)
GFR, Estimated: >60 mL/min (ref >60)
Glucose, Bld: 156 mg/dL — ABNORMAL HIGH (ref 70–99)
Potassium: 3.8 mmol/L (ref 3.5–5.1)
Sodium: 138 mmol/L (ref 135–145)
Total Bilirubin: 0.6 mg/dL (ref 0.3–1.2)
Total Protein: 7.6 g/dL (ref 6.5–8.1)

## 2023-02-11 MED ORDER — DIPHENHYDRAMINE HCL 50 MG/ML IJ SOLN
25.0000 mg | Freq: Once | INTRAMUSCULAR | Status: AC
  Administered 2023-02-11: 25 mg via INTRAVENOUS
  Filled 2023-02-11: qty 1

## 2023-02-11 MED ORDER — METOCLOPRAMIDE HCL 5 MG/ML IJ SOLN
10.0000 mg | Freq: Once | INTRAMUSCULAR | Status: AC
  Administered 2023-02-11: 10 mg via INTRAVENOUS
  Filled 2023-02-11: qty 2

## 2023-02-11 MED ORDER — SODIUM CHLORIDE 0.9 % IV BOLUS
1000.0000 mL | Freq: Once | INTRAVENOUS | Status: AC
  Administered 2023-02-11: 1000 mL via INTRAVENOUS

## 2023-02-11 MED ORDER — KETOROLAC TROMETHAMINE 15 MG/ML IJ SOLN
15.0000 mg | Freq: Once | INTRAMUSCULAR | Status: AC
  Administered 2023-02-11: 15 mg via INTRAVENOUS
  Filled 2023-02-11: qty 1

## 2023-02-11 MED ORDER — METOCLOPRAMIDE HCL 10 MG PO TABS: 10.0000 mg | ORAL_TABLET | Freq: Three times a day (TID) | ORAL | 0 refills | Status: AC | PRN

## 2023-02-11 NOTE — ED Provider Notes (Signed)
[facility] Provider Note    Event Date/Time   First MD Initiated Contact with Patient 02/11/23 1103     (approximate)   History   Headache and Nausea   HPI  Gloria Mccarthy is a 27 y.o. female with history of anxiety and depression who presents with episodic headache for the last month, frontal and bilateral in location, associated with photophobia as well as nausea and multiple episodes of vomiting.  The patient states that the headache happens about every other day at times although there are certain days where she does not feel it at all.  She was seen twice at the [facility] ED for this last week and given medication.  She was also diagnosed with COVID last week.  She denies any cough or shortness of breath.  She has had some epigastric discomfort associated with the vomiting.  The patient states that she has had occasionally had headaches in the past but never a pattern or intensity like this.  I reviewed the past medical records.  I confirmed that the patient was seen at the [facility] ED on 7/8 and 7/9 for headache.  She had a positive COVID test on 7/9.  CBC showed elevated WBC count.  She did not have any imaging during those visits.   Physical Exam   Triage Vital Signs: ED Triage Vitals  Encounter Vitals Group     BP 02/11/23 1020 (!) 143/97     Systolic BP Percentile --      Diastolic BP Percentile --      Pulse Rate 02/11/23 1020 68     Resp 02/11/23 1020 20     Temp 02/11/23 1020 98.6 F (37 C)     Temp Source 02/11/23 1020 Oral     SpO2 02/11/23 1020 100 %     Weight 02/11/23 1015 222 lb 10.6 oz (101 kg)     Height 02/11/23 1015 5\' 5"  (1.651 m)     Head Circumference --      Peak Flow --      Pain Score 02/11/23 1015 10     Pain Loc --      Pain Education --      Exclude from Growth Chart --     Most recent vital signs: Vitals:   02/11/23 1020 02/11/23 1645  BP: (!) 143/97 (!) 144/97  Pulse: 68 65  Resp: 20 18  Temp: 98.6  F (37 C) 98 F (36.7 C)  SpO2: 100% 98%     General: Awake, uncomfortable appearing but in no distress.  CV:  Good peripheral perfusion.  Resp:  Normal effort.  Abd:  Soft and nontender.  No distention.  Other:  EOMI.  PERRLA.  Mild photophobia.  Normal speech.  Cranial nerves III through XII grossly intact.  Motor and sensory intact in all extremities.  Normal coordination.  Neck supple with full range of motion.  No meningeal signs.   ED Results / Procedures / Treatments   Labs (all labs ordered are listed, but only abnormal results are displayed) Labs Reviewed  COMPREHENSIVE METABOLIC PANEL - Abnormal; Notable for the following components:      Result Value   CO2 18 (*)    Glucose, Bld 156 (*)    ALT 91 (*)    All other components within normal limits  CBC WITH DIFFERENTIAL/PLATELET - Abnormal; Notable for the following components:   WBC 12.5 (*)    Neutro Abs 11.1 (*)  Abs Immature Granulocytes 0.08 (*)    All other components within normal limits     EKG     RADIOLOGY  MR brain:  IMPRESSION:  Normal noncontrast MRI appearance of the Brain.    PROCEDURES:  Critical Care performed: No  Procedures   MEDICATIONS ORDERED IN ED: Medications  sodium chloride 0.9 % bolus 1,000 mL (0 mLs Intravenous Stopped 02/11/23 1551)  metoCLOPramide (REGLAN) injection 10 mg (10 mg Intravenous Given 02/11/23 1200)  diphenhydrAMINE (BENADRYL) injection 25 mg (25 mg Intravenous Given 02/11/23 1200)  metoCLOPramide (REGLAN) injection 10 mg (10 mg Intravenous Given 02/11/23 1348)  ketorolac (TORADOL) 15 MG/ML injection 15 mg (15 mg Intravenous Given 02/11/23 1348)     IMPRESSION / MDM / ASSESSMENT AND PLAN / ED COURSE  I reviewed the triage vital signs and the nursing notes.  27 year old female with PMH as noted above presents with episodic headache over the last month associate with nausea, vomiting, photophobia.  She was seen twice at the [facility] ED last week and  diagnosed with COVID.  She did not have any imaging at that time.  She was treated successfully with a migraine cocktail that gave her temporary relief.  Currently, the patient is overall well-appearing.  Neurologic exam is nonfocal.  Vital signs are normal.  There are no meningeal signs.  Differential diagnosis includes, but is not limited to, migraine headache, tension headache, other benign headache pattern.  I have a low suspicion for [facility] given the subacute and episodic nature of the symptoms.  There is no clinical evidence for meningitis.  However, based on the fact that the patient has sustained recurrent headaches over a month with no prior history of similar episodes we will obtain an MRI as well as basic labs.  Will treat with fluids and Reglan.  Patient's presentation is most consistent with acute complicated illness / injury requiring diagnostic workup.  ----------------------------------------- 4:26 PM on 02/11/2023 -----------------------------------------  MRI is negative.  Lab workup shows decreasing leukocytosis from last week and normal electrolytes.  The patient had relief with Reglan and Benadryl, however the headache started to come back.  She got a second dose of Reglan and some Toradol.  The headache is now resolved.  At this time, the patient is stable for discharge home.  I have given her outpatient neurology referral as well as a prescription for Reglan for symptomatic treatment.  I gave strict return precautions and she expressed understanding.   FINAL CLINICAL IMPRESSION(S) / ED DIAGNOSES   Final diagnoses:  Chronic migraine without aura without status migrainosus, not intractable     Rx / DC Orders   ED Discharge Orders          Ordered    metoCLOPramide (REGLAN) 10 MG tablet  Every 8 hours PRN        02/11/23 1625             Note:  This document was prepared using Dragon voice recognition software and may include unintentional dictation errors.     Dionne Bucy, MD 02/11/23 1740

## 2023-02-11 NOTE — ED Notes (Signed)
Patient is resting in a darkened room. Appears more comfortable.

## 2023-02-11 NOTE — ED Notes (Signed)
Pt to MRI

## 2023-02-11 NOTE — Discharge Instructions (Addendum)
You may try taking the Reglan as needed for headache.  Make sure to drink plenty of fluids.  Follow-up with a neurologist.  Referral has been provided.  Return here or to the nearest ER for new, worsening, or persistent severe headache, vomiting, weakness or lightheadedness, vision changes, fever, neck stiffness, or any other new or worsening symptoms that concern you.

## 2023-02-11 NOTE — ED Triage Notes (Signed)
Pt reports HA constantly for 1 month and vomiting. Has been seen at [facility] multiple times for the same. Pt reports they did labs and gave her meds but they did not do anything else. Pt reports pain is across the front of her forehead. Denies history of migraines. Pt reports her last visit at [facility] they told her that the [facility] in her system is causing her symptoms.  Pt reports she has tried ibuprofen and tylenol with no relief.
